# Patient Record
Sex: Female | Born: 1945
Health system: Southern US, Community
[De-identification: ages and names within clinical notes are randomized; demographics above are authoritative.]

## PROBLEM LIST (undated history)

## (undated) DIAGNOSIS — M1990 Unspecified osteoarthritis, unspecified site: Secondary | ICD-10-CM

## (undated) DIAGNOSIS — I10 Essential (primary) hypertension: Secondary | ICD-10-CM

## (undated) DIAGNOSIS — E079 Disorder of thyroid, unspecified: Secondary | ICD-10-CM

## (undated) DIAGNOSIS — M109 Gout, unspecified: Secondary | ICD-10-CM

## (undated) HISTORY — PX: CYST REMOVAL TRUNK: SHX6283

---

## 2007-09-28 ENCOUNTER — Ambulatory Visit (HOSPITAL_COMMUNITY): Admission: RE | Admit: 2007-09-28 | Discharge: 2007-09-28 | Payer: Self-pay | Admitting: Orthopaedic Surgery

## 2007-10-12 ENCOUNTER — Encounter (HOSPITAL_COMMUNITY): Admission: RE | Admit: 2007-10-12 | Discharge: 2007-11-11 | Payer: Self-pay | Admitting: Orthopaedic Surgery

## 2007-12-12 ENCOUNTER — Emergency Department (HOSPITAL_COMMUNITY): Admission: EM | Admit: 2007-12-12 | Discharge: 2007-12-12 | Payer: Self-pay | Admitting: Emergency Medicine

## 2007-12-15 ENCOUNTER — Emergency Department (HOSPITAL_COMMUNITY): Admission: EM | Admit: 2007-12-15 | Discharge: 2007-12-15 | Payer: Self-pay | Admitting: Family Medicine

## 2008-11-05 ENCOUNTER — Emergency Department (HOSPITAL_COMMUNITY): Admission: EM | Admit: 2008-11-05 | Discharge: 2008-11-05 | Payer: Self-pay | Admitting: Family Medicine

## 2009-12-13 ENCOUNTER — Ambulatory Visit: Payer: Self-pay | Admitting: Otolaryngology

## 2010-04-08 ENCOUNTER — Emergency Department (HOSPITAL_COMMUNITY)
Admission: EM | Admit: 2010-04-08 | Discharge: 2010-04-08 | Payer: Self-pay | Source: Home / Self Care | Admitting: Emergency Medicine

## 2011-01-15 LAB — POCT URINALYSIS DIP (DEVICE)
Bilirubin Urine: NEGATIVE
Hgb urine dipstick: NEGATIVE
Ketones, ur: NEGATIVE
Specific Gravity, Urine: 1.01
pH: 7

## 2014-06-02 ENCOUNTER — Other Ambulatory Visit (HOSPITAL_COMMUNITY): Payer: Self-pay | Admitting: Family Medicine

## 2014-06-08 ENCOUNTER — Other Ambulatory Visit (HOSPITAL_COMMUNITY): Payer: Self-pay | Admitting: Family Medicine

## 2014-06-08 DIAGNOSIS — Z780 Asymptomatic menopausal state: Secondary | ICD-10-CM

## 2014-06-08 DIAGNOSIS — Z8262 Family history of osteoporosis: Secondary | ICD-10-CM

## 2014-06-14 ENCOUNTER — Other Ambulatory Visit (HOSPITAL_COMMUNITY): Payer: Self-pay

## 2014-07-07 ENCOUNTER — Other Ambulatory Visit (HOSPITAL_COMMUNITY): Payer: Self-pay

## 2014-07-10 ENCOUNTER — Ambulatory Visit (HOSPITAL_COMMUNITY)
Admission: RE | Admit: 2014-07-10 | Discharge: 2014-07-10 | Disposition: A | Discharge status: Home / Self Care | Payer: 59 | Source: Ambulatory Visit | Attending: Family Medicine | Admitting: Family Medicine

## 2014-07-10 DIAGNOSIS — Z8262 Family history of osteoporosis: Secondary | ICD-10-CM | POA: Insufficient documentation

## 2014-07-10 DIAGNOSIS — M8580 Other specified disorders of bone density and structure, unspecified site: Secondary | ICD-10-CM | POA: Diagnosis not present

## 2014-07-10 DIAGNOSIS — Z780 Asymptomatic menopausal state: Secondary | ICD-10-CM | POA: Insufficient documentation

## 2015-05-01 DIAGNOSIS — E039 Hypothyroidism, unspecified: Secondary | ICD-10-CM | POA: Diagnosis not present

## 2015-05-01 DIAGNOSIS — R739 Hyperglycemia, unspecified: Secondary | ICD-10-CM | POA: Diagnosis not present

## 2015-05-01 DIAGNOSIS — Z1389 Encounter for screening for other disorder: Secondary | ICD-10-CM | POA: Diagnosis not present

## 2015-05-01 DIAGNOSIS — E782 Mixed hyperlipidemia: Secondary | ICD-10-CM | POA: Diagnosis not present

## 2015-05-01 DIAGNOSIS — I10 Essential (primary) hypertension: Secondary | ICD-10-CM | POA: Diagnosis not present

## 2015-05-01 DIAGNOSIS — M109 Gout, unspecified: Secondary | ICD-10-CM | POA: Diagnosis not present

## 2015-05-01 DIAGNOSIS — Z6828 Body mass index (BMI) 28.0-28.9, adult: Secondary | ICD-10-CM | POA: Diagnosis not present

## 2015-05-17 DIAGNOSIS — Z1389 Encounter for screening for other disorder: Secondary | ICD-10-CM | POA: Diagnosis not present

## 2015-05-17 DIAGNOSIS — E039 Hypothyroidism, unspecified: Secondary | ICD-10-CM | POA: Diagnosis not present

## 2015-05-17 DIAGNOSIS — I10 Essential (primary) hypertension: Secondary | ICD-10-CM | POA: Diagnosis not present

## 2015-05-17 DIAGNOSIS — M109 Gout, unspecified: Secondary | ICD-10-CM | POA: Diagnosis not present

## 2015-05-17 DIAGNOSIS — E782 Mixed hyperlipidemia: Secondary | ICD-10-CM | POA: Diagnosis not present

## 2015-05-29 DIAGNOSIS — Z6827 Body mass index (BMI) 27.0-27.9, adult: Secondary | ICD-10-CM | POA: Diagnosis not present

## 2015-05-29 DIAGNOSIS — Z1389 Encounter for screening for other disorder: Secondary | ICD-10-CM | POA: Diagnosis not present

## 2015-05-29 DIAGNOSIS — E785 Hyperlipidemia, unspecified: Secondary | ICD-10-CM | POA: Diagnosis not present

## 2015-06-27 DIAGNOSIS — E663 Overweight: Secondary | ICD-10-CM | POA: Diagnosis not present

## 2015-06-27 DIAGNOSIS — J040 Acute laryngitis: Secondary | ICD-10-CM | POA: Diagnosis not present

## 2015-06-27 DIAGNOSIS — Z6827 Body mass index (BMI) 27.0-27.9, adult: Secondary | ICD-10-CM | POA: Diagnosis not present

## 2015-06-27 DIAGNOSIS — Z1389 Encounter for screening for other disorder: Secondary | ICD-10-CM | POA: Diagnosis not present

## 2015-06-27 DIAGNOSIS — J209 Acute bronchitis, unspecified: Secondary | ICD-10-CM | POA: Diagnosis not present

## 2015-06-27 MED FILL — LISINOPRIL-HCTZ 20-12.5 MG: 20-12.5 | 90 days supply | Qty: 90 | Fill #0

## 2015-06-27 MED FILL — PANTOPRAZOLE SOD DR 40 MG T: 40 | 90 days supply | Qty: 90 | Fill #0

## 2015-06-27 MED FILL — ALLOPURINOL 300 MG TABLET: 300 | 90 days supply | Qty: 90 | Fill #0

## 2015-06-27 MED FILL — METOPROLOL SUCC ER 25 MG TA: 25 | 90 days supply | Qty: 90 | Fill #0

## 2015-07-26 MED FILL — LEVOTHYROXINE 25 MCG TABLET: 25 | 30 days supply | Qty: 30 | Fill #0

## 2015-08-28 MED FILL — LEVOTHYROXINE 25 MCG TABLET: 25 | 30 days supply | Qty: 30 | Fill #0

## 2015-09-11 DIAGNOSIS — E063 Autoimmune thyroiditis: Secondary | ICD-10-CM | POA: Diagnosis not present

## 2015-09-11 DIAGNOSIS — R7309 Other abnormal glucose: Secondary | ICD-10-CM | POA: Diagnosis not present

## 2015-09-11 DIAGNOSIS — Z6827 Body mass index (BMI) 27.0-27.9, adult: Secondary | ICD-10-CM | POA: Diagnosis not present

## 2015-09-11 DIAGNOSIS — Z1389 Encounter for screening for other disorder: Secondary | ICD-10-CM | POA: Diagnosis not present

## 2015-09-11 DIAGNOSIS — E782 Mixed hyperlipidemia: Secondary | ICD-10-CM | POA: Diagnosis not present

## 2015-09-25 MED FILL — LISINOPRIL-HCTZ 20-12.5 MG: 20-12.5 | 90 days supply | Qty: 90 | Fill #1

## 2015-09-25 MED FILL — ALLOPURINOL 300 MG TABLET: 300 | 90 days supply | Qty: 90 | Fill #1

## 2015-09-25 MED FILL — PANTOPRAZOLE SOD DR 40 MG T: 40 | 90 days supply | Qty: 90 | Fill #1

## 2015-09-25 MED FILL — LEVOTHYROXINE 25 MCG TABLET: 25 | 30 days supply | Qty: 30 | Fill #1

## 2015-09-25 MED FILL — METOPROLOL SUCC ER 25 MG TA: 25 | 90 days supply | Qty: 90 | Fill #1

## 2015-09-28 DIAGNOSIS — R7309 Other abnormal glucose: Secondary | ICD-10-CM | POA: Diagnosis not present

## 2015-09-28 DIAGNOSIS — E782 Mixed hyperlipidemia: Secondary | ICD-10-CM | POA: Diagnosis not present

## 2015-09-28 DIAGNOSIS — Z1389 Encounter for screening for other disorder: Secondary | ICD-10-CM | POA: Diagnosis not present

## 2015-09-28 DIAGNOSIS — E063 Autoimmune thyroiditis: Secondary | ICD-10-CM | POA: Diagnosis not present

## 2015-10-24 MED FILL — LEVOTHYROXINE 25 MCG TABLET: 25 | 30 days supply | Qty: 30 | Fill #2

## 2015-11-26 MED FILL — LEVOTHYROXINE 25 MCG TABLET: 25 | 30 days supply | Qty: 30 | Fill #0

## 2015-12-03 DIAGNOSIS — H524 Presbyopia: Secondary | ICD-10-CM | POA: Diagnosis not present

## 2015-12-03 DIAGNOSIS — H26051 Posterior subcapsular polar infantile and juvenile cataract, right eye: Secondary | ICD-10-CM | POA: Diagnosis not present

## 2015-12-03 DIAGNOSIS — H52223 Regular astigmatism, bilateral: Secondary | ICD-10-CM | POA: Diagnosis not present

## 2015-12-03 DIAGNOSIS — H5203 Hypermetropia, bilateral: Secondary | ICD-10-CM | POA: Diagnosis not present

## 2015-12-14 DIAGNOSIS — K219 Gastro-esophageal reflux disease without esophagitis: Secondary | ICD-10-CM | POA: Diagnosis not present

## 2015-12-14 DIAGNOSIS — L299 Pruritus, unspecified: Secondary | ICD-10-CM | POA: Diagnosis not present

## 2015-12-14 DIAGNOSIS — R21 Rash and other nonspecific skin eruption: Secondary | ICD-10-CM | POA: Diagnosis not present

## 2015-12-14 DIAGNOSIS — S0081XD Abrasion of other part of head, subsequent encounter: Secondary | ICD-10-CM | POA: Diagnosis not present

## 2015-12-14 DIAGNOSIS — Z1389 Encounter for screening for other disorder: Secondary | ICD-10-CM | POA: Diagnosis not present

## 2015-12-14 DIAGNOSIS — Z6827 Body mass index (BMI) 27.0-27.9, adult: Secondary | ICD-10-CM | POA: Diagnosis not present

## 2015-12-18 DIAGNOSIS — Z1389 Encounter for screening for other disorder: Secondary | ICD-10-CM | POA: Diagnosis not present

## 2015-12-18 DIAGNOSIS — L299 Pruritus, unspecified: Secondary | ICD-10-CM | POA: Diagnosis not present

## 2015-12-24 MED FILL — LEVOTHYROXINE 25 MCG TABLET: 25 | 60 days supply | Qty: 60 | Fill #1

## 2015-12-27 MED FILL — ALLOPURINOL 300 MG TABLET: 300 | 90 days supply | Qty: 90 | Fill #2

## 2015-12-27 MED FILL — METOPROLOL SUCC ER 25 MG TA: 25 | 90 days supply | Qty: 90 | Fill #2

## 2015-12-27 MED FILL — LISINOPRIL-HCTZ 20-12.5 MG: 20-12.5 | 90 days supply | Qty: 90 | Fill #2

## 2015-12-27 MED FILL — PANTOPRAZOLE SOD DR 40 MG T: 40 | 90 days supply | Qty: 90 | Fill #2

## 2016-02-25 MED FILL — LEVOTHYROXINE 25 MCG TABLET: 25 | 90 days supply | Qty: 90 | Fill #0

## 2016-03-21 MED FILL — LISINOPRIL-HCTZ 20-12.5 MG: 20-12.5 | 90 days supply | Qty: 90 | Fill #3

## 2016-03-21 MED FILL — METOPROLOL SUCC ER 25 MG TA: 25 | 90 days supply | Qty: 90 | Fill #3

## 2016-03-21 MED FILL — PANTOPRAZOLE SOD DR 40 MG T: 40 | 90 days supply | Qty: 90 | Fill #3

## 2016-03-21 MED FILL — ALLOPURINOL 300 MG TABLET: 300 | 90 days supply | Qty: 90 | Fill #3

## 2016-04-08 DIAGNOSIS — M109 Gout, unspecified: Secondary | ICD-10-CM | POA: Diagnosis not present

## 2016-04-08 DIAGNOSIS — J302 Other seasonal allergic rhinitis: Secondary | ICD-10-CM | POA: Diagnosis not present

## 2016-04-08 DIAGNOSIS — Z6828 Body mass index (BMI) 28.0-28.9, adult: Secondary | ICD-10-CM | POA: Diagnosis not present

## 2016-04-08 DIAGNOSIS — I10 Essential (primary) hypertension: Secondary | ICD-10-CM | POA: Diagnosis not present

## 2016-04-08 DIAGNOSIS — E782 Mixed hyperlipidemia: Secondary | ICD-10-CM | POA: Diagnosis not present

## 2016-04-08 DIAGNOSIS — K219 Gastro-esophageal reflux disease without esophagitis: Secondary | ICD-10-CM | POA: Diagnosis not present

## 2016-04-08 DIAGNOSIS — G894 Chronic pain syndrome: Secondary | ICD-10-CM | POA: Diagnosis not present

## 2016-04-08 DIAGNOSIS — F419 Anxiety disorder, unspecified: Secondary | ICD-10-CM | POA: Diagnosis not present

## 2016-04-08 DIAGNOSIS — E119 Type 2 diabetes mellitus without complications: Secondary | ICD-10-CM | POA: Diagnosis not present

## 2016-04-08 DIAGNOSIS — E663 Overweight: Secondary | ICD-10-CM | POA: Diagnosis not present

## 2016-04-08 DIAGNOSIS — Z1389 Encounter for screening for other disorder: Secondary | ICD-10-CM | POA: Diagnosis not present

## 2016-05-26 MED FILL — LEVOTHYROXINE 25 MCG TABLET: 25 | 90 days supply | Qty: 90 | Fill #1

## 2016-06-03 DIAGNOSIS — E782 Mixed hyperlipidemia: Secondary | ICD-10-CM | POA: Diagnosis not present

## 2016-06-03 DIAGNOSIS — J329 Chronic sinusitis, unspecified: Secondary | ICD-10-CM | POA: Diagnosis not present

## 2016-06-03 DIAGNOSIS — Z6827 Body mass index (BMI) 27.0-27.9, adult: Secondary | ICD-10-CM | POA: Diagnosis not present

## 2016-06-03 DIAGNOSIS — Z1389 Encounter for screening for other disorder: Secondary | ICD-10-CM | POA: Diagnosis not present

## 2016-06-20 DIAGNOSIS — Z6827 Body mass index (BMI) 27.0-27.9, adult: Secondary | ICD-10-CM | POA: Diagnosis not present

## 2016-06-20 DIAGNOSIS — I10 Essential (primary) hypertension: Secondary | ICD-10-CM | POA: Diagnosis not present

## 2016-06-20 DIAGNOSIS — K219 Gastro-esophageal reflux disease without esophagitis: Secondary | ICD-10-CM | POA: Diagnosis not present

## 2016-06-20 DIAGNOSIS — Z1389 Encounter for screening for other disorder: Secondary | ICD-10-CM | POA: Diagnosis not present

## 2016-06-20 DIAGNOSIS — E782 Mixed hyperlipidemia: Secondary | ICD-10-CM | POA: Diagnosis not present

## 2016-06-20 MED FILL — METOPROLOL SUCC ER 25 MG TA: 25 | 90 days supply | Qty: 90 | Fill #0

## 2016-06-20 MED FILL — PANTOPRAZOLE SOD DR 40 MG T: 40 | 90 days supply | Qty: 90 | Fill #0

## 2016-06-20 MED FILL — ALLOPURINOL 300 MG TABLET: 300 | 90 days supply | Qty: 90 | Fill #0

## 2016-06-20 MED FILL — LISINOPRIL-HCTZ 20-12.5 MG: 20-12.5 | 90 days supply | Qty: 90 | Fill #0

## 2016-07-04 DIAGNOSIS — I10 Essential (primary) hypertension: Secondary | ICD-10-CM | POA: Diagnosis not present

## 2016-07-04 DIAGNOSIS — K219 Gastro-esophageal reflux disease without esophagitis: Secondary | ICD-10-CM | POA: Diagnosis not present

## 2016-07-04 DIAGNOSIS — M150 Primary generalized (osteo)arthritis: Secondary | ICD-10-CM | POA: Diagnosis not present

## 2016-07-04 DIAGNOSIS — E782 Mixed hyperlipidemia: Secondary | ICD-10-CM | POA: Diagnosis not present

## 2016-07-04 DIAGNOSIS — R6884 Jaw pain: Secondary | ICD-10-CM | POA: Diagnosis not present

## 2016-07-04 DIAGNOSIS — E039 Hypothyroidism, unspecified: Secondary | ICD-10-CM | POA: Diagnosis not present

## 2016-07-04 DIAGNOSIS — I7300 Raynaud's syndrome without gangrene: Secondary | ICD-10-CM | POA: Diagnosis not present

## 2016-07-04 DIAGNOSIS — R7301 Impaired fasting glucose: Secondary | ICD-10-CM | POA: Diagnosis not present

## 2016-07-04 DIAGNOSIS — F411 Generalized anxiety disorder: Secondary | ICD-10-CM | POA: Diagnosis not present

## 2016-07-23 DIAGNOSIS — E039 Hypothyroidism, unspecified: Secondary | ICD-10-CM | POA: Diagnosis not present

## 2016-07-23 DIAGNOSIS — R7301 Impaired fasting glucose: Secondary | ICD-10-CM | POA: Diagnosis not present

## 2016-07-23 DIAGNOSIS — I10 Essential (primary) hypertension: Secondary | ICD-10-CM | POA: Diagnosis not present

## 2016-07-26 DIAGNOSIS — I7300 Raynaud's syndrome without gangrene: Secondary | ICD-10-CM | POA: Diagnosis not present

## 2016-07-26 DIAGNOSIS — E039 Hypothyroidism, unspecified: Secondary | ICD-10-CM | POA: Diagnosis not present

## 2016-07-26 DIAGNOSIS — M150 Primary generalized (osteo)arthritis: Secondary | ICD-10-CM | POA: Diagnosis not present

## 2016-07-26 DIAGNOSIS — M1009 Idiopathic gout, multiple sites: Secondary | ICD-10-CM | POA: Diagnosis not present

## 2016-07-26 DIAGNOSIS — K219 Gastro-esophageal reflux disease without esophagitis: Secondary | ICD-10-CM | POA: Diagnosis not present

## 2016-07-26 DIAGNOSIS — R7301 Impaired fasting glucose: Secondary | ICD-10-CM | POA: Diagnosis not present

## 2016-07-26 DIAGNOSIS — I10 Essential (primary) hypertension: Secondary | ICD-10-CM | POA: Diagnosis not present

## 2016-07-26 DIAGNOSIS — R6884 Jaw pain: Secondary | ICD-10-CM | POA: Diagnosis not present

## 2016-07-26 DIAGNOSIS — E782 Mixed hyperlipidemia: Secondary | ICD-10-CM | POA: Diagnosis not present

## 2016-08-21 MED FILL — LEVOTHYROXINE 25 MCG TABLET: 25 | 90 days supply | Qty: 90 | Fill #0

## 2016-09-22 MED FILL — ALLOPURINOL 300 MG TABLET: 300 | 90 days supply | Qty: 90 | Fill #1

## 2016-09-22 MED FILL — METOPROLOL SUCC ER 25 MG TA: 25 | 90 days supply | Qty: 90 | Fill #1

## 2016-09-22 MED FILL — LISINOPRIL-HCTZ 20-12.5 MG: 20-12.5 | 90 days supply | Qty: 90 | Fill #1

## 2016-09-22 MED FILL — PANTOPRAZOLE SOD DR 40 MG T: 40 | 90 days supply | Qty: 90 | Fill #1

## 2016-11-14 DIAGNOSIS — H10501 Unspecified blepharoconjunctivitis, right eye: Secondary | ICD-10-CM | POA: Diagnosis not present

## 2016-11-18 MED FILL — LEVOTHYROXINE 25 MCG TABLET: 25 | 90 days supply | Qty: 90 | Fill #1 | Status: TO

## 2016-11-26 DIAGNOSIS — Z1159 Encounter for screening for other viral diseases: Secondary | ICD-10-CM | POA: Diagnosis not present

## 2016-11-26 DIAGNOSIS — I10 Essential (primary) hypertension: Secondary | ICD-10-CM | POA: Diagnosis not present

## 2016-11-26 DIAGNOSIS — R7301 Impaired fasting glucose: Secondary | ICD-10-CM | POA: Diagnosis not present

## 2016-11-26 DIAGNOSIS — E039 Hypothyroidism, unspecified: Secondary | ICD-10-CM | POA: Diagnosis not present

## 2016-11-28 DIAGNOSIS — I10 Essential (primary) hypertension: Secondary | ICD-10-CM | POA: Diagnosis not present

## 2016-11-28 DIAGNOSIS — E782 Mixed hyperlipidemia: Secondary | ICD-10-CM | POA: Diagnosis not present

## 2016-11-28 DIAGNOSIS — M1009 Idiopathic gout, multiple sites: Secondary | ICD-10-CM | POA: Diagnosis not present

## 2016-11-28 DIAGNOSIS — M150 Primary generalized (osteo)arthritis: Secondary | ICD-10-CM | POA: Diagnosis not present

## 2016-11-28 DIAGNOSIS — F411 Generalized anxiety disorder: Secondary | ICD-10-CM | POA: Diagnosis not present

## 2016-11-28 DIAGNOSIS — K219 Gastro-esophageal reflux disease without esophagitis: Secondary | ICD-10-CM | POA: Diagnosis not present

## 2016-11-28 DIAGNOSIS — L309 Dermatitis, unspecified: Secondary | ICD-10-CM | POA: Diagnosis not present

## 2016-11-28 DIAGNOSIS — R7301 Impaired fasting glucose: Secondary | ICD-10-CM | POA: Diagnosis not present

## 2016-11-28 DIAGNOSIS — E039 Hypothyroidism, unspecified: Secondary | ICD-10-CM | POA: Diagnosis not present

## 2016-12-03 DIAGNOSIS — H52223 Regular astigmatism, bilateral: Secondary | ICD-10-CM | POA: Diagnosis not present

## 2016-12-03 DIAGNOSIS — H524 Presbyopia: Secondary | ICD-10-CM | POA: Diagnosis not present

## 2016-12-03 DIAGNOSIS — H5203 Hypermetropia, bilateral: Secondary | ICD-10-CM | POA: Diagnosis not present

## 2016-12-17 MED FILL — ALLOPURINOL 300 MG TABLET: 300 | 90 days supply | Qty: 90 | Fill #2 | Status: TO

## 2016-12-17 MED FILL — METOPROLOL SUCC ER 25 MG TA: 25 | 90 days supply | Qty: 90 | Fill #2 | Status: TO

## 2016-12-17 MED FILL — LISINOPRIL-HCTZ 20-12.5 MG: 20-12.5 | 90 days supply | Qty: 90 | Fill #2 | Status: TO

## 2016-12-17 MED FILL — PANTOPRAZOLE SOD DR 40 MG T: 40 | 90 days supply | Qty: 90 | Fill #2 | Status: TO

## 2017-06-03 DIAGNOSIS — R7301 Impaired fasting glucose: Secondary | ICD-10-CM | POA: Diagnosis not present

## 2017-06-03 DIAGNOSIS — I10 Essential (primary) hypertension: Secondary | ICD-10-CM | POA: Diagnosis not present

## 2017-06-03 DIAGNOSIS — E039 Hypothyroidism, unspecified: Secondary | ICD-10-CM | POA: Diagnosis not present

## 2017-06-03 DIAGNOSIS — F411 Generalized anxiety disorder: Secondary | ICD-10-CM | POA: Diagnosis not present

## 2017-06-03 DIAGNOSIS — E782 Mixed hyperlipidemia: Secondary | ICD-10-CM | POA: Diagnosis not present

## 2017-06-05 DIAGNOSIS — C44300 Unspecified malignant neoplasm of skin of unspecified part of face: Secondary | ICD-10-CM | POA: Diagnosis not present

## 2017-06-05 DIAGNOSIS — I7300 Raynaud's syndrome without gangrene: Secondary | ICD-10-CM | POA: Diagnosis not present

## 2017-06-05 DIAGNOSIS — E782 Mixed hyperlipidemia: Secondary | ICD-10-CM | POA: Diagnosis not present

## 2017-06-05 DIAGNOSIS — K219 Gastro-esophageal reflux disease without esophagitis: Secondary | ICD-10-CM | POA: Diagnosis not present

## 2017-06-05 DIAGNOSIS — M150 Primary generalized (osteo)arthritis: Secondary | ICD-10-CM | POA: Diagnosis not present

## 2017-06-05 DIAGNOSIS — Z6823 Body mass index (BMI) 23.0-23.9, adult: Secondary | ICD-10-CM | POA: Diagnosis not present

## 2017-06-05 DIAGNOSIS — E039 Hypothyroidism, unspecified: Secondary | ICD-10-CM | POA: Diagnosis not present

## 2017-06-05 DIAGNOSIS — J069 Acute upper respiratory infection, unspecified: Secondary | ICD-10-CM | POA: Diagnosis not present

## 2017-06-05 DIAGNOSIS — F411 Generalized anxiety disorder: Secondary | ICD-10-CM | POA: Diagnosis not present

## 2017-06-05 DIAGNOSIS — I10 Essential (primary) hypertension: Secondary | ICD-10-CM | POA: Diagnosis not present

## 2017-06-05 DIAGNOSIS — R7301 Impaired fasting glucose: Secondary | ICD-10-CM | POA: Diagnosis not present

## 2017-07-19 DIAGNOSIS — J060 Acute laryngopharyngitis: Secondary | ICD-10-CM | POA: Diagnosis not present

## 2017-07-19 DIAGNOSIS — J0100 Acute maxillary sinusitis, unspecified: Secondary | ICD-10-CM | POA: Diagnosis not present

## 2017-11-30 DIAGNOSIS — M1009 Idiopathic gout, multiple sites: Secondary | ICD-10-CM | POA: Diagnosis not present

## 2017-11-30 DIAGNOSIS — E039 Hypothyroidism, unspecified: Secondary | ICD-10-CM | POA: Diagnosis not present

## 2017-11-30 DIAGNOSIS — F411 Generalized anxiety disorder: Secondary | ICD-10-CM | POA: Diagnosis not present

## 2017-11-30 DIAGNOSIS — E782 Mixed hyperlipidemia: Secondary | ICD-10-CM | POA: Diagnosis not present

## 2017-11-30 DIAGNOSIS — I7300 Raynaud's syndrome without gangrene: Secondary | ICD-10-CM | POA: Diagnosis not present

## 2017-11-30 DIAGNOSIS — R7301 Impaired fasting glucose: Secondary | ICD-10-CM | POA: Diagnosis not present

## 2017-11-30 DIAGNOSIS — K219 Gastro-esophageal reflux disease without esophagitis: Secondary | ICD-10-CM | POA: Diagnosis not present

## 2017-11-30 DIAGNOSIS — J0100 Acute maxillary sinusitis, unspecified: Secondary | ICD-10-CM | POA: Diagnosis not present

## 2017-11-30 DIAGNOSIS — Z6825 Body mass index (BMI) 25.0-25.9, adult: Secondary | ICD-10-CM | POA: Diagnosis not present

## 2017-11-30 DIAGNOSIS — J069 Acute upper respiratory infection, unspecified: Secondary | ICD-10-CM | POA: Diagnosis not present

## 2017-11-30 DIAGNOSIS — Z6823 Body mass index (BMI) 23.0-23.9, adult: Secondary | ICD-10-CM | POA: Diagnosis not present

## 2017-11-30 DIAGNOSIS — I10 Essential (primary) hypertension: Secondary | ICD-10-CM | POA: Diagnosis not present

## 2017-11-30 DIAGNOSIS — C44300 Unspecified malignant neoplasm of skin of unspecified part of face: Secondary | ICD-10-CM | POA: Diagnosis not present

## 2017-11-30 DIAGNOSIS — R51 Headache: Secondary | ICD-10-CM | POA: Diagnosis not present

## 2017-11-30 DIAGNOSIS — M150 Primary generalized (osteo)arthritis: Secondary | ICD-10-CM | POA: Diagnosis not present

## 2017-12-04 DIAGNOSIS — R7301 Impaired fasting glucose: Secondary | ICD-10-CM | POA: Diagnosis not present

## 2017-12-04 DIAGNOSIS — E039 Hypothyroidism, unspecified: Secondary | ICD-10-CM | POA: Diagnosis not present

## 2017-12-04 DIAGNOSIS — Z6825 Body mass index (BMI) 25.0-25.9, adult: Secondary | ICD-10-CM | POA: Diagnosis not present

## 2017-12-04 DIAGNOSIS — C44300 Unspecified malignant neoplasm of skin of unspecified part of face: Secondary | ICD-10-CM | POA: Diagnosis not present

## 2017-12-04 DIAGNOSIS — I10 Essential (primary) hypertension: Secondary | ICD-10-CM | POA: Diagnosis not present

## 2017-12-04 DIAGNOSIS — E782 Mixed hyperlipidemia: Secondary | ICD-10-CM | POA: Diagnosis not present

## 2017-12-04 DIAGNOSIS — J019 Acute sinusitis, unspecified: Secondary | ICD-10-CM | POA: Diagnosis not present

## 2017-12-04 DIAGNOSIS — J060 Acute laryngopharyngitis: Secondary | ICD-10-CM | POA: Diagnosis not present

## 2017-12-04 DIAGNOSIS — M1009 Idiopathic gout, multiple sites: Secondary | ICD-10-CM | POA: Diagnosis not present

## 2017-12-04 DIAGNOSIS — K219 Gastro-esophageal reflux disease without esophagitis: Secondary | ICD-10-CM | POA: Diagnosis not present

## 2017-12-04 DIAGNOSIS — F411 Generalized anxiety disorder: Secondary | ICD-10-CM | POA: Diagnosis not present

## 2017-12-04 DIAGNOSIS — M150 Primary generalized (osteo)arthritis: Secondary | ICD-10-CM | POA: Diagnosis not present

## 2017-12-07 ENCOUNTER — Other Ambulatory Visit: Payer: Self-pay | Admitting: Internal Medicine

## 2017-12-07 DIAGNOSIS — Z780 Asymptomatic menopausal state: Secondary | ICD-10-CM

## 2018-01-04 ENCOUNTER — Ambulatory Visit (HOSPITAL_COMMUNITY)
Admission: RE | Admit: 2018-01-04 | Discharge: 2018-01-04 | Disposition: A | Discharge status: Home / Self Care | Payer: PPO | Source: Ambulatory Visit | Attending: Internal Medicine | Admitting: Internal Medicine

## 2018-01-04 DIAGNOSIS — M85832 Other specified disorders of bone density and structure, left forearm: Secondary | ICD-10-CM | POA: Diagnosis not present

## 2018-01-04 DIAGNOSIS — M810 Age-related osteoporosis without current pathological fracture: Secondary | ICD-10-CM | POA: Insufficient documentation

## 2018-01-04 DIAGNOSIS — Z780 Asymptomatic menopausal state: Secondary | ICD-10-CM

## 2018-01-18 DIAGNOSIS — Z23 Encounter for immunization: Secondary | ICD-10-CM | POA: Diagnosis not present

## 2018-02-16 DIAGNOSIS — H04321 Acute dacryocystitis of right lacrimal passage: Secondary | ICD-10-CM | POA: Diagnosis not present

## 2018-02-26 DIAGNOSIS — K219 Gastro-esophageal reflux disease without esophagitis: Secondary | ICD-10-CM | POA: Diagnosis not present

## 2018-02-26 DIAGNOSIS — I10 Essential (primary) hypertension: Secondary | ICD-10-CM | POA: Diagnosis not present

## 2018-02-26 DIAGNOSIS — E039 Hypothyroidism, unspecified: Secondary | ICD-10-CM | POA: Diagnosis not present

## 2018-02-26 DIAGNOSIS — E782 Mixed hyperlipidemia: Secondary | ICD-10-CM | POA: Diagnosis not present

## 2018-03-17 DIAGNOSIS — I10 Essential (primary) hypertension: Secondary | ICD-10-CM | POA: Diagnosis not present

## 2018-03-17 DIAGNOSIS — E782 Mixed hyperlipidemia: Secondary | ICD-10-CM | POA: Diagnosis not present

## 2018-06-10 DIAGNOSIS — E782 Mixed hyperlipidemia: Secondary | ICD-10-CM | POA: Diagnosis not present

## 2018-06-10 DIAGNOSIS — K219 Gastro-esophageal reflux disease without esophagitis: Secondary | ICD-10-CM | POA: Diagnosis not present

## 2018-06-10 DIAGNOSIS — I10 Essential (primary) hypertension: Secondary | ICD-10-CM | POA: Diagnosis not present

## 2018-06-10 DIAGNOSIS — M1009 Idiopathic gout, multiple sites: Secondary | ICD-10-CM | POA: Diagnosis not present

## 2018-06-10 DIAGNOSIS — R51 Headache: Secondary | ICD-10-CM | POA: Diagnosis not present

## 2018-06-10 DIAGNOSIS — I7300 Raynaud's syndrome without gangrene: Secondary | ICD-10-CM | POA: Diagnosis not present

## 2018-06-10 DIAGNOSIS — R7301 Impaired fasting glucose: Secondary | ICD-10-CM | POA: Diagnosis not present

## 2018-06-10 DIAGNOSIS — F411 Generalized anxiety disorder: Secondary | ICD-10-CM | POA: Diagnosis not present

## 2018-06-10 DIAGNOSIS — J069 Acute upper respiratory infection, unspecified: Secondary | ICD-10-CM | POA: Diagnosis not present

## 2018-06-10 DIAGNOSIS — M150 Primary generalized (osteo)arthritis: Secondary | ICD-10-CM | POA: Diagnosis not present

## 2018-06-10 DIAGNOSIS — C44300 Unspecified malignant neoplasm of skin of unspecified part of face: Secondary | ICD-10-CM | POA: Diagnosis not present

## 2018-06-10 DIAGNOSIS — E039 Hypothyroidism, unspecified: Secondary | ICD-10-CM | POA: Diagnosis not present

## 2018-06-14 DIAGNOSIS — M1009 Idiopathic gout, multiple sites: Secondary | ICD-10-CM | POA: Diagnosis not present

## 2018-06-14 DIAGNOSIS — I10 Essential (primary) hypertension: Secondary | ICD-10-CM | POA: Diagnosis not present

## 2018-06-14 DIAGNOSIS — E039 Hypothyroidism, unspecified: Secondary | ICD-10-CM | POA: Diagnosis not present

## 2018-06-14 DIAGNOSIS — C44300 Unspecified malignant neoplasm of skin of unspecified part of face: Secondary | ICD-10-CM | POA: Diagnosis not present

## 2018-06-14 DIAGNOSIS — H0266 Xanthelasma of left eye, unspecified eyelid: Secondary | ICD-10-CM | POA: Diagnosis not present

## 2018-06-14 DIAGNOSIS — E782 Mixed hyperlipidemia: Secondary | ICD-10-CM | POA: Diagnosis not present

## 2018-06-14 DIAGNOSIS — K219 Gastro-esophageal reflux disease without esophagitis: Secondary | ICD-10-CM | POA: Diagnosis not present

## 2018-06-14 DIAGNOSIS — M810 Age-related osteoporosis without current pathological fracture: Secondary | ICD-10-CM | POA: Diagnosis not present

## 2018-06-14 DIAGNOSIS — R7301 Impaired fasting glucose: Secondary | ICD-10-CM | POA: Diagnosis not present

## 2018-06-23 DIAGNOSIS — Z85828 Personal history of other malignant neoplasm of skin: Secondary | ICD-10-CM | POA: Diagnosis not present

## 2018-06-23 DIAGNOSIS — Z08 Encounter for follow-up examination after completed treatment for malignant neoplasm: Secondary | ICD-10-CM | POA: Diagnosis not present

## 2018-06-23 DIAGNOSIS — L728 Other follicular cysts of the skin and subcutaneous tissue: Secondary | ICD-10-CM | POA: Diagnosis not present

## 2018-07-13 DIAGNOSIS — C44300 Unspecified malignant neoplasm of skin of unspecified part of face: Secondary | ICD-10-CM | POA: Diagnosis not present

## 2018-07-13 DIAGNOSIS — E782 Mixed hyperlipidemia: Secondary | ICD-10-CM | POA: Diagnosis not present

## 2018-07-13 DIAGNOSIS — M1009 Idiopathic gout, multiple sites: Secondary | ICD-10-CM | POA: Diagnosis not present

## 2018-07-13 DIAGNOSIS — E039 Hypothyroidism, unspecified: Secondary | ICD-10-CM | POA: Diagnosis not present

## 2018-07-13 DIAGNOSIS — I10 Essential (primary) hypertension: Secondary | ICD-10-CM | POA: Diagnosis not present

## 2018-07-13 DIAGNOSIS — K219 Gastro-esophageal reflux disease without esophagitis: Secondary | ICD-10-CM | POA: Diagnosis not present

## 2018-07-13 DIAGNOSIS — R7301 Impaired fasting glucose: Secondary | ICD-10-CM | POA: Diagnosis not present

## 2018-07-27 DIAGNOSIS — Z0000 Encounter for general adult medical examination without abnormal findings: Secondary | ICD-10-CM | POA: Diagnosis not present

## 2018-07-30 DIAGNOSIS — I10 Essential (primary) hypertension: Secondary | ICD-10-CM | POA: Diagnosis not present

## 2018-07-30 DIAGNOSIS — R7301 Impaired fasting glucose: Secondary | ICD-10-CM | POA: Diagnosis not present

## 2018-07-30 DIAGNOSIS — E039 Hypothyroidism, unspecified: Secondary | ICD-10-CM | POA: Diagnosis not present

## 2018-07-30 DIAGNOSIS — K219 Gastro-esophageal reflux disease without esophagitis: Secondary | ICD-10-CM | POA: Diagnosis not present

## 2018-07-30 DIAGNOSIS — M1009 Idiopathic gout, multiple sites: Secondary | ICD-10-CM | POA: Diagnosis not present

## 2018-07-30 DIAGNOSIS — C44300 Unspecified malignant neoplasm of skin of unspecified part of face: Secondary | ICD-10-CM | POA: Diagnosis not present

## 2018-07-30 DIAGNOSIS — E782 Mixed hyperlipidemia: Secondary | ICD-10-CM | POA: Diagnosis not present

## 2018-08-17 DIAGNOSIS — E039 Hypothyroidism, unspecified: Secondary | ICD-10-CM | POA: Diagnosis not present

## 2018-08-17 DIAGNOSIS — M1009 Idiopathic gout, multiple sites: Secondary | ICD-10-CM | POA: Diagnosis not present

## 2018-08-17 DIAGNOSIS — E782 Mixed hyperlipidemia: Secondary | ICD-10-CM | POA: Diagnosis not present

## 2018-08-17 DIAGNOSIS — I10 Essential (primary) hypertension: Secondary | ICD-10-CM | POA: Diagnosis not present

## 2018-11-15 ENCOUNTER — Other Ambulatory Visit: Payer: Self-pay

## 2018-12-17 DIAGNOSIS — E039 Hypothyroidism, unspecified: Secondary | ICD-10-CM | POA: Diagnosis not present

## 2018-12-17 DIAGNOSIS — R7301 Impaired fasting glucose: Secondary | ICD-10-CM | POA: Diagnosis not present

## 2018-12-17 DIAGNOSIS — I10 Essential (primary) hypertension: Secondary | ICD-10-CM | POA: Diagnosis not present

## 2018-12-17 DIAGNOSIS — M1009 Idiopathic gout, multiple sites: Secondary | ICD-10-CM | POA: Diagnosis not present

## 2018-12-17 DIAGNOSIS — E782 Mixed hyperlipidemia: Secondary | ICD-10-CM | POA: Diagnosis not present

## 2018-12-27 DIAGNOSIS — Z23 Encounter for immunization: Secondary | ICD-10-CM | POA: Diagnosis not present

## 2018-12-27 DIAGNOSIS — M1009 Idiopathic gout, multiple sites: Secondary | ICD-10-CM | POA: Diagnosis not present

## 2018-12-27 DIAGNOSIS — I739 Peripheral vascular disease, unspecified: Secondary | ICD-10-CM | POA: Diagnosis not present

## 2018-12-27 DIAGNOSIS — H0266 Xanthelasma of left eye, unspecified eyelid: Secondary | ICD-10-CM | POA: Diagnosis not present

## 2018-12-27 DIAGNOSIS — I10 Essential (primary) hypertension: Secondary | ICD-10-CM | POA: Diagnosis not present

## 2018-12-27 DIAGNOSIS — M810 Age-related osteoporosis without current pathological fracture: Secondary | ICD-10-CM | POA: Diagnosis not present

## 2018-12-27 DIAGNOSIS — C44300 Unspecified malignant neoplasm of skin of unspecified part of face: Secondary | ICD-10-CM | POA: Diagnosis not present

## 2018-12-27 DIAGNOSIS — E782 Mixed hyperlipidemia: Secondary | ICD-10-CM | POA: Diagnosis not present

## 2018-12-27 DIAGNOSIS — R7301 Impaired fasting glucose: Secondary | ICD-10-CM | POA: Diagnosis not present

## 2018-12-27 DIAGNOSIS — Z0001 Encounter for general adult medical examination with abnormal findings: Secondary | ICD-10-CM | POA: Diagnosis not present

## 2018-12-27 DIAGNOSIS — E039 Hypothyroidism, unspecified: Secondary | ICD-10-CM | POA: Diagnosis not present

## 2018-12-27 DIAGNOSIS — K219 Gastro-esophageal reflux disease without esophagitis: Secondary | ICD-10-CM | POA: Diagnosis not present

## 2019-02-23 DIAGNOSIS — I739 Peripheral vascular disease, unspecified: Secondary | ICD-10-CM | POA: Diagnosis not present

## 2019-02-23 DIAGNOSIS — C44300 Unspecified malignant neoplasm of skin of unspecified part of face: Secondary | ICD-10-CM | POA: Diagnosis not present

## 2019-02-23 DIAGNOSIS — M1009 Idiopathic gout, multiple sites: Secondary | ICD-10-CM | POA: Diagnosis not present

## 2019-02-23 DIAGNOSIS — K219 Gastro-esophageal reflux disease without esophagitis: Secondary | ICD-10-CM | POA: Diagnosis not present

## 2019-02-23 DIAGNOSIS — H0266 Xanthelasma of left eye, unspecified eyelid: Secondary | ICD-10-CM | POA: Diagnosis not present

## 2019-02-23 DIAGNOSIS — E782 Mixed hyperlipidemia: Secondary | ICD-10-CM | POA: Diagnosis not present

## 2019-02-23 DIAGNOSIS — R7301 Impaired fasting glucose: Secondary | ICD-10-CM | POA: Diagnosis not present

## 2019-02-23 DIAGNOSIS — Z23 Encounter for immunization: Secondary | ICD-10-CM | POA: Diagnosis not present

## 2019-02-23 DIAGNOSIS — I10 Essential (primary) hypertension: Secondary | ICD-10-CM | POA: Diagnosis not present

## 2019-02-23 DIAGNOSIS — M810 Age-related osteoporosis without current pathological fracture: Secondary | ICD-10-CM | POA: Diagnosis not present

## 2019-02-23 DIAGNOSIS — Z0001 Encounter for general adult medical examination with abnormal findings: Secondary | ICD-10-CM | POA: Diagnosis not present

## 2019-02-23 DIAGNOSIS — E039 Hypothyroidism, unspecified: Secondary | ICD-10-CM | POA: Diagnosis not present

## 2019-03-08 ENCOUNTER — Other Ambulatory Visit: Payer: Self-pay

## 2019-04-18 DIAGNOSIS — C44300 Unspecified malignant neoplasm of skin of unspecified part of face: Secondary | ICD-10-CM | POA: Diagnosis not present

## 2019-04-18 DIAGNOSIS — R7301 Impaired fasting glucose: Secondary | ICD-10-CM | POA: Diagnosis not present

## 2019-04-18 DIAGNOSIS — I739 Peripheral vascular disease, unspecified: Secondary | ICD-10-CM | POA: Diagnosis not present

## 2019-04-18 DIAGNOSIS — M810 Age-related osteoporosis without current pathological fracture: Secondary | ICD-10-CM | POA: Diagnosis not present

## 2019-04-18 DIAGNOSIS — E039 Hypothyroidism, unspecified: Secondary | ICD-10-CM | POA: Diagnosis not present

## 2019-04-18 DIAGNOSIS — Z23 Encounter for immunization: Secondary | ICD-10-CM | POA: Diagnosis not present

## 2019-04-18 DIAGNOSIS — I10 Essential (primary) hypertension: Secondary | ICD-10-CM | POA: Diagnosis not present

## 2019-04-18 DIAGNOSIS — Z0001 Encounter for general adult medical examination with abnormal findings: Secondary | ICD-10-CM | POA: Diagnosis not present

## 2019-04-18 DIAGNOSIS — E7849 Other hyperlipidemia: Secondary | ICD-10-CM | POA: Diagnosis not present

## 2019-04-18 DIAGNOSIS — M1009 Idiopathic gout, multiple sites: Secondary | ICD-10-CM | POA: Diagnosis not present

## 2019-04-18 DIAGNOSIS — E782 Mixed hyperlipidemia: Secondary | ICD-10-CM | POA: Diagnosis not present

## 2019-04-18 DIAGNOSIS — K219 Gastro-esophageal reflux disease without esophagitis: Secondary | ICD-10-CM | POA: Diagnosis not present

## 2019-06-15 DIAGNOSIS — M1009 Idiopathic gout, multiple sites: Secondary | ICD-10-CM | POA: Diagnosis not present

## 2019-06-15 DIAGNOSIS — E039 Hypothyroidism, unspecified: Secondary | ICD-10-CM | POA: Diagnosis not present

## 2019-06-15 DIAGNOSIS — E782 Mixed hyperlipidemia: Secondary | ICD-10-CM | POA: Diagnosis not present

## 2019-06-15 DIAGNOSIS — I10 Essential (primary) hypertension: Secondary | ICD-10-CM | POA: Diagnosis not present

## 2019-06-20 DIAGNOSIS — I10 Essential (primary) hypertension: Secondary | ICD-10-CM | POA: Diagnosis not present

## 2019-06-20 DIAGNOSIS — J069 Acute upper respiratory infection, unspecified: Secondary | ICD-10-CM | POA: Diagnosis not present

## 2019-06-20 DIAGNOSIS — M810 Age-related osteoporosis without current pathological fracture: Secondary | ICD-10-CM | POA: Diagnosis not present

## 2019-06-20 DIAGNOSIS — R7301 Impaired fasting glucose: Secondary | ICD-10-CM | POA: Diagnosis not present

## 2019-06-20 DIAGNOSIS — Z23 Encounter for immunization: Secondary | ICD-10-CM | POA: Diagnosis not present

## 2019-06-20 DIAGNOSIS — I739 Peripheral vascular disease, unspecified: Secondary | ICD-10-CM | POA: Diagnosis not present

## 2019-06-20 DIAGNOSIS — E039 Hypothyroidism, unspecified: Secondary | ICD-10-CM | POA: Diagnosis not present

## 2019-06-20 DIAGNOSIS — F411 Generalized anxiety disorder: Secondary | ICD-10-CM | POA: Diagnosis not present

## 2019-06-20 DIAGNOSIS — J019 Acute sinusitis, unspecified: Secondary | ICD-10-CM | POA: Diagnosis not present

## 2019-06-20 DIAGNOSIS — H0266 Xanthelasma of left eye, unspecified eyelid: Secondary | ICD-10-CM | POA: Diagnosis not present

## 2019-06-20 DIAGNOSIS — E782 Mixed hyperlipidemia: Secondary | ICD-10-CM | POA: Diagnosis not present

## 2019-06-20 DIAGNOSIS — K219 Gastro-esophageal reflux disease without esophagitis: Secondary | ICD-10-CM | POA: Diagnosis not present

## 2019-06-23 DIAGNOSIS — I739 Peripheral vascular disease, unspecified: Secondary | ICD-10-CM | POA: Diagnosis not present

## 2019-06-23 DIAGNOSIS — C44300 Unspecified malignant neoplasm of skin of unspecified part of face: Secondary | ICD-10-CM | POA: Diagnosis not present

## 2019-06-23 DIAGNOSIS — R7301 Impaired fasting glucose: Secondary | ICD-10-CM | POA: Diagnosis not present

## 2019-06-23 DIAGNOSIS — H0266 Xanthelasma of left eye, unspecified eyelid: Secondary | ICD-10-CM | POA: Diagnosis not present

## 2019-06-23 DIAGNOSIS — M542 Cervicalgia: Secondary | ICD-10-CM | POA: Diagnosis not present

## 2019-06-23 DIAGNOSIS — E039 Hypothyroidism, unspecified: Secondary | ICD-10-CM | POA: Diagnosis not present

## 2019-06-23 DIAGNOSIS — M810 Age-related osteoporosis without current pathological fracture: Secondary | ICD-10-CM | POA: Diagnosis not present

## 2019-06-23 DIAGNOSIS — M1009 Idiopathic gout, multiple sites: Secondary | ICD-10-CM | POA: Diagnosis not present

## 2019-06-23 DIAGNOSIS — I10 Essential (primary) hypertension: Secondary | ICD-10-CM | POA: Diagnosis not present

## 2019-06-23 DIAGNOSIS — K219 Gastro-esophageal reflux disease without esophagitis: Secondary | ICD-10-CM | POA: Diagnosis not present

## 2019-06-23 DIAGNOSIS — E782 Mixed hyperlipidemia: Secondary | ICD-10-CM | POA: Diagnosis not present

## 2019-09-05 DIAGNOSIS — E782 Mixed hyperlipidemia: Secondary | ICD-10-CM | POA: Diagnosis not present

## 2019-09-05 DIAGNOSIS — K219 Gastro-esophageal reflux disease without esophagitis: Secondary | ICD-10-CM | POA: Diagnosis not present

## 2019-09-05 DIAGNOSIS — M542 Cervicalgia: Secondary | ICD-10-CM | POA: Diagnosis not present

## 2019-09-05 DIAGNOSIS — R7301 Impaired fasting glucose: Secondary | ICD-10-CM | POA: Diagnosis not present

## 2019-09-05 DIAGNOSIS — M1009 Idiopathic gout, multiple sites: Secondary | ICD-10-CM | POA: Diagnosis not present

## 2019-09-05 DIAGNOSIS — M810 Age-related osteoporosis without current pathological fracture: Secondary | ICD-10-CM | POA: Diagnosis not present

## 2019-09-05 DIAGNOSIS — H0266 Xanthelasma of left eye, unspecified eyelid: Secondary | ICD-10-CM | POA: Diagnosis not present

## 2019-09-05 DIAGNOSIS — I739 Peripheral vascular disease, unspecified: Secondary | ICD-10-CM | POA: Diagnosis not present

## 2019-09-05 DIAGNOSIS — E039 Hypothyroidism, unspecified: Secondary | ICD-10-CM | POA: Diagnosis not present

## 2019-09-05 DIAGNOSIS — I10 Essential (primary) hypertension: Secondary | ICD-10-CM | POA: Diagnosis not present

## 2019-09-05 DIAGNOSIS — C44300 Unspecified malignant neoplasm of skin of unspecified part of face: Secondary | ICD-10-CM | POA: Diagnosis not present

## 2019-10-19 DIAGNOSIS — K219 Gastro-esophageal reflux disease without esophagitis: Secondary | ICD-10-CM | POA: Diagnosis not present

## 2019-10-19 DIAGNOSIS — I739 Peripheral vascular disease, unspecified: Secondary | ICD-10-CM | POA: Diagnosis not present

## 2019-10-19 DIAGNOSIS — M810 Age-related osteoporosis without current pathological fracture: Secondary | ICD-10-CM | POA: Diagnosis not present

## 2019-10-19 DIAGNOSIS — C44300 Unspecified malignant neoplasm of skin of unspecified part of face: Secondary | ICD-10-CM | POA: Diagnosis not present

## 2019-10-19 DIAGNOSIS — H0266 Xanthelasma of left eye, unspecified eyelid: Secondary | ICD-10-CM | POA: Diagnosis not present

## 2019-10-19 DIAGNOSIS — M1009 Idiopathic gout, multiple sites: Secondary | ICD-10-CM | POA: Diagnosis not present

## 2019-10-19 DIAGNOSIS — E782 Mixed hyperlipidemia: Secondary | ICD-10-CM | POA: Diagnosis not present

## 2019-10-19 DIAGNOSIS — E039 Hypothyroidism, unspecified: Secondary | ICD-10-CM | POA: Diagnosis not present

## 2019-10-19 DIAGNOSIS — M542 Cervicalgia: Secondary | ICD-10-CM | POA: Diagnosis not present

## 2019-10-19 DIAGNOSIS — I10 Essential (primary) hypertension: Secondary | ICD-10-CM | POA: Diagnosis not present

## 2019-10-19 DIAGNOSIS — R7301 Impaired fasting glucose: Secondary | ICD-10-CM | POA: Diagnosis not present

## 2019-10-21 DIAGNOSIS — T6591XA Toxic effect of unspecified substance, accidental (unintentional), initial encounter: Secondary | ICD-10-CM | POA: Diagnosis not present

## 2019-11-08 ENCOUNTER — Other Ambulatory Visit: Payer: Self-pay

## 2019-12-06 DIAGNOSIS — G729 Myopathy, unspecified: Secondary | ICD-10-CM | POA: Diagnosis not present

## 2019-12-06 DIAGNOSIS — I10 Essential (primary) hypertension: Secondary | ICD-10-CM | POA: Diagnosis not present

## 2019-12-06 DIAGNOSIS — M1009 Idiopathic gout, multiple sites: Secondary | ICD-10-CM | POA: Diagnosis not present

## 2019-12-06 DIAGNOSIS — E039 Hypothyroidism, unspecified: Secondary | ICD-10-CM | POA: Diagnosis not present

## 2019-12-06 DIAGNOSIS — E7849 Other hyperlipidemia: Secondary | ICD-10-CM | POA: Diagnosis not present

## 2019-12-29 DIAGNOSIS — J069 Acute upper respiratory infection, unspecified: Secondary | ICD-10-CM | POA: Diagnosis not present

## 2019-12-29 DIAGNOSIS — E782 Mixed hyperlipidemia: Secondary | ICD-10-CM | POA: Diagnosis not present

## 2019-12-29 DIAGNOSIS — J019 Acute sinusitis, unspecified: Secondary | ICD-10-CM | POA: Diagnosis not present

## 2019-12-29 DIAGNOSIS — F411 Generalized anxiety disorder: Secondary | ICD-10-CM | POA: Diagnosis not present

## 2019-12-29 DIAGNOSIS — I10 Essential (primary) hypertension: Secondary | ICD-10-CM | POA: Diagnosis not present

## 2019-12-29 DIAGNOSIS — K219 Gastro-esophageal reflux disease without esophagitis: Secondary | ICD-10-CM | POA: Diagnosis not present

## 2019-12-29 DIAGNOSIS — M810 Age-related osteoporosis without current pathological fracture: Secondary | ICD-10-CM | POA: Diagnosis not present

## 2019-12-29 DIAGNOSIS — Z23 Encounter for immunization: Secondary | ICD-10-CM | POA: Diagnosis not present

## 2019-12-29 DIAGNOSIS — H0266 Xanthelasma of left eye, unspecified eyelid: Secondary | ICD-10-CM | POA: Diagnosis not present

## 2019-12-29 DIAGNOSIS — R7301 Impaired fasting glucose: Secondary | ICD-10-CM | POA: Diagnosis not present

## 2019-12-29 DIAGNOSIS — E039 Hypothyroidism, unspecified: Secondary | ICD-10-CM | POA: Diagnosis not present

## 2019-12-29 DIAGNOSIS — I739 Peripheral vascular disease, unspecified: Secondary | ICD-10-CM | POA: Diagnosis not present

## 2020-01-13 DIAGNOSIS — Z23 Encounter for immunization: Secondary | ICD-10-CM | POA: Diagnosis not present

## 2020-01-23 DIAGNOSIS — M542 Cervicalgia: Secondary | ICD-10-CM | POA: Diagnosis not present

## 2020-01-23 DIAGNOSIS — H0266 Xanthelasma of left eye, unspecified eyelid: Secondary | ICD-10-CM | POA: Diagnosis not present

## 2020-01-23 DIAGNOSIS — I10 Essential (primary) hypertension: Secondary | ICD-10-CM | POA: Diagnosis not present

## 2020-01-23 DIAGNOSIS — K219 Gastro-esophageal reflux disease without esophagitis: Secondary | ICD-10-CM | POA: Diagnosis not present

## 2020-01-23 DIAGNOSIS — E782 Mixed hyperlipidemia: Secondary | ICD-10-CM | POA: Diagnosis not present

## 2020-01-23 DIAGNOSIS — C44300 Unspecified malignant neoplasm of skin of unspecified part of face: Secondary | ICD-10-CM | POA: Diagnosis not present

## 2020-01-23 DIAGNOSIS — E039 Hypothyroidism, unspecified: Secondary | ICD-10-CM | POA: Diagnosis not present

## 2020-01-23 DIAGNOSIS — R7301 Impaired fasting glucose: Secondary | ICD-10-CM | POA: Diagnosis not present

## 2020-01-23 DIAGNOSIS — I739 Peripheral vascular disease, unspecified: Secondary | ICD-10-CM | POA: Diagnosis not present

## 2020-01-23 DIAGNOSIS — M1009 Idiopathic gout, multiple sites: Secondary | ICD-10-CM | POA: Diagnosis not present

## 2020-01-23 DIAGNOSIS — M810 Age-related osteoporosis without current pathological fracture: Secondary | ICD-10-CM | POA: Diagnosis not present

## 2020-02-08 DIAGNOSIS — M810 Age-related osteoporosis without current pathological fracture: Secondary | ICD-10-CM | POA: Diagnosis not present

## 2020-02-08 DIAGNOSIS — K219 Gastro-esophageal reflux disease without esophagitis: Secondary | ICD-10-CM | POA: Diagnosis not present

## 2020-02-08 DIAGNOSIS — E039 Hypothyroidism, unspecified: Secondary | ICD-10-CM | POA: Diagnosis not present

## 2020-02-08 DIAGNOSIS — M542 Cervicalgia: Secondary | ICD-10-CM | POA: Diagnosis not present

## 2020-02-08 DIAGNOSIS — M1009 Idiopathic gout, multiple sites: Secondary | ICD-10-CM | POA: Diagnosis not present

## 2020-02-08 DIAGNOSIS — E782 Mixed hyperlipidemia: Secondary | ICD-10-CM | POA: Diagnosis not present

## 2020-02-08 DIAGNOSIS — H0266 Xanthelasma of left eye, unspecified eyelid: Secondary | ICD-10-CM | POA: Diagnosis not present

## 2020-02-08 DIAGNOSIS — R7301 Impaired fasting glucose: Secondary | ICD-10-CM | POA: Diagnosis not present

## 2020-02-08 DIAGNOSIS — I10 Essential (primary) hypertension: Secondary | ICD-10-CM | POA: Diagnosis not present

## 2020-02-08 DIAGNOSIS — C44300 Unspecified malignant neoplasm of skin of unspecified part of face: Secondary | ICD-10-CM | POA: Diagnosis not present

## 2020-02-08 DIAGNOSIS — I739 Peripheral vascular disease, unspecified: Secondary | ICD-10-CM | POA: Diagnosis not present

## 2020-04-13 DIAGNOSIS — M1009 Idiopathic gout, multiple sites: Secondary | ICD-10-CM | POA: Diagnosis not present

## 2020-04-13 DIAGNOSIS — C44300 Unspecified malignant neoplasm of skin of unspecified part of face: Secondary | ICD-10-CM | POA: Diagnosis not present

## 2020-04-13 DIAGNOSIS — M542 Cervicalgia: Secondary | ICD-10-CM | POA: Diagnosis not present

## 2020-04-13 DIAGNOSIS — M810 Age-related osteoporosis without current pathological fracture: Secondary | ICD-10-CM | POA: Diagnosis not present

## 2020-04-13 DIAGNOSIS — I739 Peripheral vascular disease, unspecified: Secondary | ICD-10-CM | POA: Diagnosis not present

## 2020-04-13 DIAGNOSIS — I10 Essential (primary) hypertension: Secondary | ICD-10-CM | POA: Diagnosis not present

## 2020-04-13 DIAGNOSIS — K219 Gastro-esophageal reflux disease without esophagitis: Secondary | ICD-10-CM | POA: Diagnosis not present

## 2020-04-13 DIAGNOSIS — E782 Mixed hyperlipidemia: Secondary | ICD-10-CM | POA: Diagnosis not present

## 2020-04-13 DIAGNOSIS — R7301 Impaired fasting glucose: Secondary | ICD-10-CM | POA: Diagnosis not present

## 2020-04-13 DIAGNOSIS — E039 Hypothyroidism, unspecified: Secondary | ICD-10-CM | POA: Diagnosis not present

## 2020-04-13 DIAGNOSIS — H0266 Xanthelasma of left eye, unspecified eyelid: Secondary | ICD-10-CM | POA: Diagnosis not present

## 2020-04-27 ENCOUNTER — Ambulatory Visit
Admission: EM | Admit: 2020-04-27 | Discharge: 2020-04-27 | Disposition: A | Discharge status: Home / Self Care | Payer: PPO

## 2020-04-27 ENCOUNTER — Other Ambulatory Visit: Payer: Self-pay

## 2020-04-27 ENCOUNTER — Encounter: Payer: Self-pay | Admitting: Emergency Medicine

## 2020-04-27 DIAGNOSIS — J0100 Acute maxillary sinusitis, unspecified: Secondary | ICD-10-CM

## 2020-04-27 HISTORY — DX: Unspecified osteoarthritis, unspecified site: M19.90

## 2020-04-27 HISTORY — DX: Essential (primary) hypertension: I10

## 2020-04-27 HISTORY — DX: Disorder of thyroid, unspecified: E07.9

## 2020-04-27 MED ORDER — DEXAMETHASONE 4 MG PO TABS: 4.0000 mg | ORAL_TABLET | Freq: Every day | ORAL | 0 refills | Status: AC

## 2020-04-27 MED ORDER — DOXYCYCLINE HYCLATE 100 MG PO CAPS: 100.0000 mg | ORAL_CAPSULE | Freq: Two times a day (BID) | ORAL | 0 refills | Status: DC

## 2020-04-27 NOTE — Discharge Instructions (Addendum)
Rest and drink plenty of fluids Prescribed doxycycline  Prescribed Decadron Take medications as directed and to completion Continue to use OTC ibuprofen and/ or tylenol as needed for pain control Follow up with PCP if symptoms persists Return here or go to the ER if you have any new or worsening symptoms

## 2020-04-27 NOTE — ED Provider Notes (Signed)
[facility]   [id number] 04/27/20 Arrival Time: 8502  CC:EAR PAIN  SUBJECTIVE: History from: patient.  Toni Gibson is a 75 y.o. female who presented to the urgent care for complaint of left ear pain and left sinus pressure and pain for the past 1 week.  Reported yellowish and bloody nasal sputum.  Denies a precipitating event, such as swimming or wearing ear plugs.  Patient states the pain is constant and achy in character.  Has tried OTC Mucinex sinus without relief.  Denies alleviating or aggravating  factor.  Report similar symptoms in the past that improved with doxycycline.     Denies fever, chills, fatigue, rhinorrhea, ear discharge, sore throat, SOB, wheezing, chest pain, nausea, changes in bowel or bladder habits.    ROS: As per HPI.  All other pertinent ROS negative.      Past Medical History:  Diagnosis Date  . Arthritis   . Hypertension   . Thyroid disease    History reviewed. No pertinent surgical history. Allergies  Allergen Reactions  . Codeine   . Sulfa Antibiotics   . Zithromax [Azithromycin]    No current facility-administered medications on file prior to encounter.   Current Outpatient Medications on File Prior to Encounter  Medication Sig Dispense Refill  . allopurinol (ZYLOPRIM) 300 MG tablet Take 300 mg by mouth daily.    Marland Kitchen aspirin EC 81 MG tablet Take 81 mg by mouth daily. Swallow whole.    . levothyroxine (SYNTHROID) 25 MCG tablet Take 25 mcg by mouth daily before breakfast.    . lisinopril (ZESTRIL) 20 MG tablet Take 20 mg by mouth daily.    . pantoprazole (PROTONIX) 40 MG tablet Take 40 mg by mouth daily.     Social History   Socioeconomic History  . Marital status: Married    Spouse name: Not on file  . Number of children: Not on file  . Years of education: Not on file  . Highest education level: Not on file  Occupational History  . Not on file  Tobacco Use  . Smoking status: Former Research scientist (life sciences)  . Smokeless tobacco: Never Used   Vaping Use  . Vaping Use: Never used  Substance and Sexual Activity  . Alcohol use: Never  . Drug use: Never  . Sexual activity: Not on file  Other Topics Concern  . Not on file  Social History Narrative  . Not on file   Social Determinants of Health   Financial Resource Strain: Not on file  Food Insecurity: Not on file  Transportation Needs: Not on file  Physical Activity: Not on file  Stress: Not on file  Social Connections: Not on file  Intimate Partner Violence: Not on file   History reviewed. No pertinent family history.  OBJECTIVE:  Vitals:   04/27/20 0909 04/27/20 0916  BP:  (!) 164/93  Pulse:  (!) 107  Resp:  18  Temp:  97.7 F (36.5 C)  TempSrc:  Oral  SpO2:  96%  Weight: 168 lb (76.2 kg)   Height: 5\' 6"  (1.676 m)      Physical Exam Vitals and nursing note reviewed.  Constitutional:      General: She is not in acute distress.    Appearance: Normal appearance. She is normal weight. She is not ill-appearing, toxic-appearing or diaphoretic.  HENT:     Head: Normocephalic.     Left Ear: Tympanic membrane, ear canal and external ear normal. There is no impacted cerumen.  Nose: Congestion present.     Left Sinus: Maxillary sinus tenderness present.     Mouth/Throat:     Mouth: Mucous membranes are moist.     Pharynx: Oropharynx is clear. No oropharyngeal exudate.  Cardiovascular:     Rate and Rhythm: Normal rate and regular rhythm.     Pulses: Normal pulses.     Heart sounds: Normal heart sounds. No murmur heard. No friction rub. No gallop.   Pulmonary:     Effort: Pulmonary effort is normal. No respiratory distress.     Breath sounds: Normal breath sounds. No stridor. No wheezing, rhonchi or rales.  Chest:     Chest wall: No tenderness.  Neurological:     Mental Status: She is alert and oriented to person, place, and time.     Imaging: No results found.   ASSESSMENT & PLAN:  1. Acute non-recurrent maxillary sinusitis     Meds ordered  this encounter  Medications  . doxycycline (VIBRAMYCIN) 100 MG capsule    Sig: Take 1 capsule (100 mg total) by mouth 2 (two) times daily.    Dispense:  20 capsule    Refill:  0  . dexamethasone (DECADRON) 4 MG tablet    Sig: Take 1 tablet (4 mg total) by mouth daily for 7 days.    Dispense:  7 tablet    Refill:  0     Discharge Instructions  Rest and drink plenty of fluids Prescribed doxycycline  Prescribed Decadron Take medications as directed and to completion Continue to use OTC ibuprofen and/ or tylenol as needed for pain control Follow up with PCP if symptoms persists Return here or go to the ER if you have any new or worsening symptoms   Reviewed expectations re: course of current medical issues. Questions answered. Outlined signs and symptoms indicating need for more acute intervention. Patient verbalized understanding. After Visit Summary given.         Emerson Monte, [place] 04/27/20 [phone]

## 2020-04-27 NOTE — ED Triage Notes (Signed)
Sinus pressure on left side and left ear pain since last week

## 2020-06-11 DIAGNOSIS — M1009 Idiopathic gout, multiple sites: Secondary | ICD-10-CM | POA: Diagnosis not present

## 2020-06-11 DIAGNOSIS — G729 Myopathy, unspecified: Secondary | ICD-10-CM | POA: Diagnosis not present

## 2020-06-11 DIAGNOSIS — I10 Essential (primary) hypertension: Secondary | ICD-10-CM | POA: Diagnosis not present

## 2020-06-11 DIAGNOSIS — E039 Hypothyroidism, unspecified: Secondary | ICD-10-CM | POA: Diagnosis not present

## 2020-06-11 DIAGNOSIS — E782 Mixed hyperlipidemia: Secondary | ICD-10-CM | POA: Diagnosis not present

## 2020-06-28 DIAGNOSIS — H40003 Preglaucoma, unspecified, bilateral: Secondary | ICD-10-CM | POA: Diagnosis not present

## 2020-07-25 DIAGNOSIS — M810 Age-related osteoporosis without current pathological fracture: Secondary | ICD-10-CM | POA: Diagnosis not present

## 2020-07-25 DIAGNOSIS — R7301 Impaired fasting glucose: Secondary | ICD-10-CM | POA: Diagnosis not present

## 2020-07-25 DIAGNOSIS — E039 Hypothyroidism, unspecified: Secondary | ICD-10-CM | POA: Diagnosis not present

## 2020-07-25 DIAGNOSIS — E7849 Other hyperlipidemia: Secondary | ICD-10-CM | POA: Diagnosis not present

## 2020-07-25 DIAGNOSIS — I10 Essential (primary) hypertension: Secondary | ICD-10-CM | POA: Diagnosis not present

## 2020-07-25 DIAGNOSIS — E782 Mixed hyperlipidemia: Secondary | ICD-10-CM | POA: Diagnosis not present

## 2020-07-25 DIAGNOSIS — M150 Primary generalized (osteo)arthritis: Secondary | ICD-10-CM | POA: Diagnosis not present

## 2020-07-30 DIAGNOSIS — R7301 Impaired fasting glucose: Secondary | ICD-10-CM | POA: Diagnosis not present

## 2020-07-30 DIAGNOSIS — M542 Cervicalgia: Secondary | ICD-10-CM | POA: Diagnosis not present

## 2020-07-30 DIAGNOSIS — K219 Gastro-esophageal reflux disease without esophagitis: Secondary | ICD-10-CM | POA: Diagnosis not present

## 2020-07-30 DIAGNOSIS — M810 Age-related osteoporosis without current pathological fracture: Secondary | ICD-10-CM | POA: Diagnosis not present

## 2020-07-30 DIAGNOSIS — I739 Peripheral vascular disease, unspecified: Secondary | ICD-10-CM | POA: Diagnosis not present

## 2020-07-30 DIAGNOSIS — H0266 Xanthelasma of left eye, unspecified eyelid: Secondary | ICD-10-CM | POA: Diagnosis not present

## 2020-07-30 DIAGNOSIS — M1009 Idiopathic gout, multiple sites: Secondary | ICD-10-CM | POA: Diagnosis not present

## 2020-07-30 DIAGNOSIS — C44300 Unspecified malignant neoplasm of skin of unspecified part of face: Secondary | ICD-10-CM | POA: Diagnosis not present

## 2020-07-30 DIAGNOSIS — I10 Essential (primary) hypertension: Secondary | ICD-10-CM | POA: Diagnosis not present

## 2020-07-30 DIAGNOSIS — M25512 Pain in left shoulder: Secondary | ICD-10-CM | POA: Diagnosis not present

## 2020-07-30 DIAGNOSIS — E782 Mixed hyperlipidemia: Secondary | ICD-10-CM | POA: Diagnosis not present

## 2020-07-30 DIAGNOSIS — E039 Hypothyroidism, unspecified: Secondary | ICD-10-CM | POA: Diagnosis not present

## 2020-09-17 DIAGNOSIS — N390 Urinary tract infection, site not specified: Secondary | ICD-10-CM | POA: Diagnosis not present

## 2020-09-17 DIAGNOSIS — L259 Unspecified contact dermatitis, unspecified cause: Secondary | ICD-10-CM | POA: Diagnosis not present

## 2020-12-24 DIAGNOSIS — U071 COVID-19: Secondary | ICD-10-CM | POA: Diagnosis not present

## 2021-01-31 DIAGNOSIS — Z23 Encounter for immunization: Secondary | ICD-10-CM | POA: Diagnosis not present

## 2021-02-08 DIAGNOSIS — E782 Mixed hyperlipidemia: Secondary | ICD-10-CM | POA: Diagnosis not present

## 2021-02-08 DIAGNOSIS — I10 Essential (primary) hypertension: Secondary | ICD-10-CM | POA: Diagnosis not present

## 2021-02-08 DIAGNOSIS — E039 Hypothyroidism, unspecified: Secondary | ICD-10-CM | POA: Diagnosis not present

## 2021-02-11 DIAGNOSIS — E785 Hyperlipidemia, unspecified: Secondary | ICD-10-CM | POA: Diagnosis not present

## 2021-02-11 DIAGNOSIS — I10 Essential (primary) hypertension: Secondary | ICD-10-CM | POA: Diagnosis not present

## 2021-02-12 DIAGNOSIS — R7301 Impaired fasting glucose: Secondary | ICD-10-CM | POA: Diagnosis not present

## 2021-02-12 DIAGNOSIS — I739 Peripheral vascular disease, unspecified: Secondary | ICD-10-CM | POA: Diagnosis not present

## 2021-02-12 DIAGNOSIS — M542 Cervicalgia: Secondary | ICD-10-CM | POA: Diagnosis not present

## 2021-02-12 DIAGNOSIS — M109 Gout, unspecified: Secondary | ICD-10-CM | POA: Diagnosis not present

## 2021-02-12 DIAGNOSIS — M810 Age-related osteoporosis without current pathological fracture: Secondary | ICD-10-CM | POA: Diagnosis not present

## 2021-02-12 DIAGNOSIS — K219 Gastro-esophageal reflux disease without esophagitis: Secondary | ICD-10-CM | POA: Diagnosis not present

## 2021-02-12 DIAGNOSIS — Z0001 Encounter for general adult medical examination with abnormal findings: Secondary | ICD-10-CM | POA: Diagnosis not present

## 2021-02-12 DIAGNOSIS — E782 Mixed hyperlipidemia: Secondary | ICD-10-CM | POA: Diagnosis not present

## 2021-02-12 DIAGNOSIS — I10 Essential (primary) hypertension: Secondary | ICD-10-CM | POA: Diagnosis not present

## 2021-02-12 DIAGNOSIS — R7401 Elevation of levels of liver transaminase levels: Secondary | ICD-10-CM | POA: Diagnosis not present

## 2021-02-12 DIAGNOSIS — Z85828 Personal history of other malignant neoplasm of skin: Secondary | ICD-10-CM | POA: Diagnosis not present

## 2021-02-12 DIAGNOSIS — E039 Hypothyroidism, unspecified: Secondary | ICD-10-CM | POA: Diagnosis not present

## 2021-02-13 ENCOUNTER — Other Ambulatory Visit (HOSPITAL_COMMUNITY): Payer: Self-pay | Admitting: Internal Medicine

## 2021-02-13 DIAGNOSIS — M810 Age-related osteoporosis without current pathological fracture: Secondary | ICD-10-CM

## 2021-02-20 ENCOUNTER — Other Ambulatory Visit: Payer: Self-pay

## 2021-02-20 ENCOUNTER — Ambulatory Visit (HOSPITAL_COMMUNITY)
Admission: RE | Admit: 2021-02-20 | Discharge: 2021-02-20 | Disposition: A | Discharge status: Home / Self Care | Payer: PPO | Source: Ambulatory Visit | Attending: Internal Medicine | Admitting: Internal Medicine

## 2021-02-20 DIAGNOSIS — M810 Age-related osteoporosis without current pathological fracture: Secondary | ICD-10-CM | POA: Diagnosis not present

## 2021-02-20 DIAGNOSIS — Z780 Asymptomatic menopausal state: Secondary | ICD-10-CM | POA: Diagnosis not present

## 2021-02-20 DIAGNOSIS — M85832 Other specified disorders of bone density and structure, left forearm: Secondary | ICD-10-CM | POA: Diagnosis not present

## 2021-03-13 DIAGNOSIS — E785 Hyperlipidemia, unspecified: Secondary | ICD-10-CM | POA: Diagnosis not present

## 2021-03-13 DIAGNOSIS — I10 Essential (primary) hypertension: Secondary | ICD-10-CM | POA: Diagnosis not present

## 2021-05-14 DIAGNOSIS — I10 Essential (primary) hypertension: Secondary | ICD-10-CM | POA: Diagnosis not present

## 2021-05-14 DIAGNOSIS — E785 Hyperlipidemia, unspecified: Secondary | ICD-10-CM | POA: Diagnosis not present

## 2021-06-25 DIAGNOSIS — R3914 Feeling of incomplete bladder emptying: Secondary | ICD-10-CM | POA: Diagnosis not present

## 2021-06-25 DIAGNOSIS — R300 Dysuria: Secondary | ICD-10-CM | POA: Diagnosis not present

## 2021-06-25 DIAGNOSIS — N390 Urinary tract infection, site not specified: Secondary | ICD-10-CM | POA: Diagnosis not present

## 2021-08-25 ENCOUNTER — Ambulatory Visit
Admission: RE | Admit: 2021-08-25 | Discharge: 2021-08-25 | Disposition: A | Discharge status: Home / Self Care | Payer: PPO | Source: Ambulatory Visit | Attending: Internal Medicine | Admitting: Internal Medicine

## 2021-08-25 ENCOUNTER — Ambulatory Visit: Payer: Self-pay

## 2021-08-25 VITALS — BP 173/103 | HR 96 | Temp 98.0°F | Resp 18

## 2021-08-25 DIAGNOSIS — R21 Rash and other nonspecific skin eruption: Secondary | ICD-10-CM | POA: Diagnosis not present

## 2021-08-25 MED ORDER — CLOTRIMAZOLE-BETAMETHASONE 1-0.05 % EX CREA: TOPICAL_CREAM | CUTANEOUS | 0 refills | Status: AC

## 2021-08-25 NOTE — Discharge Instructions (Addendum)
He is apply the lotion to the affected area ?If you notice worsening of the rash i.e. spreading of the rash or redness please return to the urgent care to be reevaluated. ?

## 2021-08-25 NOTE — ED Triage Notes (Signed)
Rash on lower left leg that itches and burns since Friday. Hx of this rash before. She can't remember the medications she was given to treat the rash.   ?

## 2021-08-26 NOTE — ED Provider Notes (Signed)
?[facility] ? ? ? ?CSN: [id number] ?Arrival date & time: 08/25/21  1042 ? ? ?  ? ?History   ?Chief Complaint ?Chief Complaint  ?Patient presents with  ? Rash  ?  On leg - Entered by patient  ? ? ?HPI ?Toni Gibson is a 76 y.o. female comes to the urgent care with 3-day history of itchy burning rash over the left leg.  Onset of rash was insidious and has been persistent.  Patient denies any change in cosmetics, body lotion or soaps.  No fever or chills.  Area involved in the rash has been stable since Friday.  No swelling or discharge.  No trauma to the area. ?HPI ? ?Past Medical History:  ?Diagnosis Date  ? Arthritis   ? Hypertension   ? Thyroid disease   ? ? ?There are no problems to display for this patient. ? ? ?History reviewed. No pertinent surgical history. ? ?OB History   ?No obstetric history on file. ?  ? ? ? ?Home Medications   ? ?Prior to Admission medications   ?Medication Sig Start Date End Date Taking? Authorizing Provider  ?clotrimazole-betamethasone (LOTRISONE) cream Apply to affected area 2 times daily prn 08/25/21  Yes Fitzroy Mikami, Myrene Galas, MD  ?allopurinol (ZYLOPRIM) 300 MG tablet Take 300 mg by mouth daily.    [provider]  ?aspirin EC 81 MG tablet Take 81 mg by mouth daily. Swallow whole.    [provider]  ?levothyroxine (SYNTHROID) 25 MCG tablet Take 25 mcg by mouth daily before breakfast.    [provider]  ?lisinopril (ZESTRIL) 20 MG tablet Take 20 mg by mouth daily.    [provider]  ?pantoprazole (PROTONIX) 40 MG tablet Take 40 mg by mouth daily.    [provider]  ? ? ?Family History ?History reviewed. No pertinent family history. ? ?Social History ?Social History  ? ?Tobacco Use  ? Smoking status: Former  ? Smokeless tobacco: Never  ?Vaping Use  ? Vaping Use: Never used  ?Substance Use Topics  ? Alcohol use: Never  ? Drug use: Never  ? ? ? ?Allergies   ?Codeine, Sulfa antibiotics, and Zithromax [azithromycin] ? ? ?Review of  Systems ?Review of Systems  ?Constitutional: Negative.   ?Skin:  Positive for color change and rash. Negative for pallor and wound.  ? ? ?Physical Exam ?Triage Vital Signs ?ED Triage Vitals [08/25/21 1103]  ?Enc Vitals Group  ?   BP (!) 173/103  ?   Pulse Rate 96  ?   Resp 18  ?   Temp 98 ?F (36.7 ?C)  ?   Temp Source Oral  ?   SpO2 99 %  ?   Weight   ?   Height   ?   Head Circumference   ?   Peak Flow   ?   Pain Score   ?   Pain Loc   ?   Pain Edu?   ?   Excl. in [place]?   ? ?No data found. ? ?Updated Vital Signs ?BP (!) 173/103 (BP Location: Right Arm)   Pulse 96   Temp 98 ?F (36.7 ?C) (Oral)   Resp 18   SpO2 99%  ? ?Visual Acuity ?Right Eye Distance:   ?Left Eye Distance:   ?Bilateral Distance:   ? ?Right Eye Near:   ?Left Eye Near:    ?Bilateral Near:    ? ?Physical Exam ?Vitals and nursing note reviewed.  ?Constitutional:   ?  General: She is not in acute distress. ?   Appearance: She is not ill-appearing.  ?Cardiovascular:  ?   Rate and Rhythm: Normal rate and regular rhythm.  ?Musculoskeletal:     ?   General: No swelling or tenderness. Normal range of motion.  ?Skin: ?   General: Skin is warm.  ?   Findings: Erythema and rash present.  ?   Comments: Well-circumscribed area with mild erythema and a few papular lesions.  Nontender to palpation.  Full range of motion of the knee.  The rash is distal to the tibial tubercle of the left leg.  ?Neurological:  ?   Mental Status: She is alert.  ? ? ? ?UC Treatments / Results  ?Labs ?(all labs ordered are listed, but only abnormal results are displayed) ?Labs Reviewed - No data to display ? ?EKG ? ? ?Radiology ?No results found. ? ?Procedures ?Procedures (including critical care time) ? ?Medications Ordered in UC ?Medications - No data to display ? ?Initial Impression / Assessment and Plan / UC Course  ?I have reviewed the triage vital signs and the nursing notes. ? ?Pertinent labs & imaging results that were available during my care of the patient were reviewed by  me and considered in my medical decision making (see chart for details). ? ?  ? ?Rash over left leg: ?Lotrisone cream ?No indication for antibiotics since the rash does not look cellulitic ?Return precautions given to patient ?Tylenol as needed for pain. ?Final Clinical Impressions(s) / UC Diagnoses  ? ?Final diagnoses:  ?Rash in adult  ? ? ? ?Discharge Instructions   ? ?  ?He is apply the lotion to the affected area ?If you notice worsening of the rash i.e. spreading of the rash or redness please return to the urgent care to be reevaluated. ? ? ?ED Prescriptions   ? ? Medication Sig Dispense Auth. Provider  ? clotrimazole-betamethasone (LOTRISONE) cream Apply to affected area 2 times daily prn 15 g Valon Glasscock, Myrene Galas, MD  ? ?  ? ?PDMP not reviewed this encounter. ?  ?Chase Picket, MD ?08/26/21 [phone] ? ?

## 2021-09-24 DIAGNOSIS — E782 Mixed hyperlipidemia: Secondary | ICD-10-CM | POA: Diagnosis not present

## 2021-09-24 DIAGNOSIS — E039 Hypothyroidism, unspecified: Secondary | ICD-10-CM | POA: Diagnosis not present

## 2021-09-24 DIAGNOSIS — R7301 Impaired fasting glucose: Secondary | ICD-10-CM | POA: Diagnosis not present

## 2021-10-01 DIAGNOSIS — M109 Gout, unspecified: Secondary | ICD-10-CM | POA: Diagnosis not present

## 2021-10-01 DIAGNOSIS — E039 Hypothyroidism, unspecified: Secondary | ICD-10-CM | POA: Diagnosis not present

## 2021-10-01 DIAGNOSIS — R350 Frequency of micturition: Secondary | ICD-10-CM | POA: Diagnosis not present

## 2021-10-01 DIAGNOSIS — M542 Cervicalgia: Secondary | ICD-10-CM | POA: Diagnosis not present

## 2021-10-01 DIAGNOSIS — I739 Peripheral vascular disease, unspecified: Secondary | ICD-10-CM | POA: Diagnosis not present

## 2021-10-01 DIAGNOSIS — K219 Gastro-esophageal reflux disease without esophagitis: Secondary | ICD-10-CM | POA: Diagnosis not present

## 2021-10-01 DIAGNOSIS — Z85828 Personal history of other malignant neoplasm of skin: Secondary | ICD-10-CM | POA: Diagnosis not present

## 2021-10-01 DIAGNOSIS — E782 Mixed hyperlipidemia: Secondary | ICD-10-CM | POA: Diagnosis not present

## 2021-10-01 DIAGNOSIS — R7301 Impaired fasting glucose: Secondary | ICD-10-CM | POA: Diagnosis not present

## 2021-10-01 DIAGNOSIS — I10 Essential (primary) hypertension: Secondary | ICD-10-CM | POA: Diagnosis not present

## 2021-10-01 DIAGNOSIS — M810 Age-related osteoporosis without current pathological fracture: Secondary | ICD-10-CM | POA: Diagnosis not present

## 2021-10-01 DIAGNOSIS — R7401 Elevation of levels of liver transaminase levels: Secondary | ICD-10-CM | POA: Diagnosis not present

## 2022-03-26 DIAGNOSIS — M810 Age-related osteoporosis without current pathological fracture: Secondary | ICD-10-CM | POA: Diagnosis not present

## 2022-03-26 DIAGNOSIS — E039 Hypothyroidism, unspecified: Secondary | ICD-10-CM | POA: Diagnosis not present

## 2022-03-26 DIAGNOSIS — E782 Mixed hyperlipidemia: Secondary | ICD-10-CM | POA: Diagnosis not present

## 2022-03-26 DIAGNOSIS — R7301 Impaired fasting glucose: Secondary | ICD-10-CM | POA: Diagnosis not present

## 2022-07-03 DIAGNOSIS — I10 Essential (primary) hypertension: Secondary | ICD-10-CM | POA: Diagnosis not present

## 2022-07-03 DIAGNOSIS — E782 Mixed hyperlipidemia: Secondary | ICD-10-CM | POA: Diagnosis not present

## 2022-07-03 DIAGNOSIS — E039 Hypothyroidism, unspecified: Secondary | ICD-10-CM | POA: Diagnosis not present

## 2022-07-03 DIAGNOSIS — H04123 Dry eye syndrome of bilateral lacrimal glands: Secondary | ICD-10-CM | POA: Diagnosis not present

## 2022-07-09 DIAGNOSIS — M542 Cervicalgia: Secondary | ICD-10-CM | POA: Diagnosis not present

## 2022-07-09 DIAGNOSIS — I739 Peripheral vascular disease, unspecified: Secondary | ICD-10-CM | POA: Diagnosis not present

## 2022-07-09 DIAGNOSIS — Z7189 Other specified counseling: Secondary | ICD-10-CM | POA: Diagnosis not present

## 2022-07-09 DIAGNOSIS — E782 Mixed hyperlipidemia: Secondary | ICD-10-CM | POA: Diagnosis not present

## 2022-07-09 DIAGNOSIS — E039 Hypothyroidism, unspecified: Secondary | ICD-10-CM | POA: Diagnosis not present

## 2022-07-09 DIAGNOSIS — M810 Age-related osteoporosis without current pathological fracture: Secondary | ICD-10-CM | POA: Diagnosis not present

## 2022-07-09 DIAGNOSIS — R7301 Impaired fasting glucose: Secondary | ICD-10-CM | POA: Diagnosis not present

## 2022-07-09 DIAGNOSIS — K219 Gastro-esophageal reflux disease without esophagitis: Secondary | ICD-10-CM | POA: Diagnosis not present

## 2022-07-09 DIAGNOSIS — M109 Gout, unspecified: Secondary | ICD-10-CM | POA: Diagnosis not present

## 2022-07-09 DIAGNOSIS — R7401 Elevation of levels of liver transaminase levels: Secondary | ICD-10-CM | POA: Diagnosis not present

## 2022-07-09 DIAGNOSIS — Z0001 Encounter for general adult medical examination with abnormal findings: Secondary | ICD-10-CM | POA: Diagnosis not present

## 2022-07-09 DIAGNOSIS — I10 Essential (primary) hypertension: Secondary | ICD-10-CM | POA: Diagnosis not present

## 2023-01-05 DIAGNOSIS — H04123 Dry eye syndrome of bilateral lacrimal glands: Secondary | ICD-10-CM | POA: Diagnosis not present

## 2023-01-06 DIAGNOSIS — R7301 Impaired fasting glucose: Secondary | ICD-10-CM | POA: Diagnosis not present

## 2023-01-06 DIAGNOSIS — M810 Age-related osteoporosis without current pathological fracture: Secondary | ICD-10-CM | POA: Diagnosis not present

## 2023-01-06 DIAGNOSIS — E782 Mixed hyperlipidemia: Secondary | ICD-10-CM | POA: Diagnosis not present

## 2023-01-06 DIAGNOSIS — E039 Hypothyroidism, unspecified: Secondary | ICD-10-CM | POA: Diagnosis not present

## 2023-01-12 DIAGNOSIS — M542 Cervicalgia: Secondary | ICD-10-CM | POA: Diagnosis not present

## 2023-01-12 DIAGNOSIS — Z85828 Personal history of other malignant neoplasm of skin: Secondary | ICD-10-CM | POA: Diagnosis not present

## 2023-01-12 DIAGNOSIS — M109 Gout, unspecified: Secondary | ICD-10-CM | POA: Diagnosis not present

## 2023-01-12 DIAGNOSIS — E785 Hyperlipidemia, unspecified: Secondary | ICD-10-CM | POA: Diagnosis not present

## 2023-01-12 DIAGNOSIS — R7301 Impaired fasting glucose: Secondary | ICD-10-CM | POA: Diagnosis not present

## 2023-01-12 DIAGNOSIS — M810 Age-related osteoporosis without current pathological fracture: Secondary | ICD-10-CM | POA: Diagnosis not present

## 2023-01-12 DIAGNOSIS — R7401 Elevation of levels of liver transaminase levels: Secondary | ICD-10-CM | POA: Diagnosis not present

## 2023-01-12 DIAGNOSIS — E782 Mixed hyperlipidemia: Secondary | ICD-10-CM | POA: Diagnosis not present

## 2023-01-12 DIAGNOSIS — I10 Essential (primary) hypertension: Secondary | ICD-10-CM | POA: Diagnosis not present

## 2023-01-12 DIAGNOSIS — I739 Peripheral vascular disease, unspecified: Secondary | ICD-10-CM | POA: Diagnosis not present

## 2023-01-12 DIAGNOSIS — E039 Hypothyroidism, unspecified: Secondary | ICD-10-CM | POA: Diagnosis not present

## 2023-01-12 DIAGNOSIS — K219 Gastro-esophageal reflux disease without esophagitis: Secondary | ICD-10-CM | POA: Diagnosis not present

## 2023-07-07 DIAGNOSIS — M810 Age-related osteoporosis without current pathological fracture: Secondary | ICD-10-CM | POA: Diagnosis not present

## 2023-07-07 DIAGNOSIS — R7301 Impaired fasting glucose: Secondary | ICD-10-CM | POA: Diagnosis not present

## 2023-07-07 DIAGNOSIS — M109 Gout, unspecified: Secondary | ICD-10-CM | POA: Diagnosis not present

## 2023-07-07 DIAGNOSIS — E782 Mixed hyperlipidemia: Secondary | ICD-10-CM | POA: Diagnosis not present

## 2023-07-07 DIAGNOSIS — E039 Hypothyroidism, unspecified: Secondary | ICD-10-CM | POA: Diagnosis not present

## 2023-07-13 DIAGNOSIS — M109 Gout, unspecified: Secondary | ICD-10-CM | POA: Diagnosis not present

## 2023-07-13 DIAGNOSIS — Z85828 Personal history of other malignant neoplasm of skin: Secondary | ICD-10-CM | POA: Diagnosis not present

## 2023-07-13 DIAGNOSIS — K219 Gastro-esophageal reflux disease without esophagitis: Secondary | ICD-10-CM | POA: Diagnosis not present

## 2023-07-13 DIAGNOSIS — M25512 Pain in left shoulder: Secondary | ICD-10-CM | POA: Diagnosis not present

## 2023-07-13 DIAGNOSIS — E782 Mixed hyperlipidemia: Secondary | ICD-10-CM | POA: Diagnosis not present

## 2023-07-13 DIAGNOSIS — I739 Peripheral vascular disease, unspecified: Secondary | ICD-10-CM | POA: Diagnosis not present

## 2023-07-13 DIAGNOSIS — E039 Hypothyroidism, unspecified: Secondary | ICD-10-CM | POA: Diagnosis not present

## 2023-07-13 DIAGNOSIS — R7301 Impaired fasting glucose: Secondary | ICD-10-CM | POA: Diagnosis not present

## 2023-07-13 DIAGNOSIS — M542 Cervicalgia: Secondary | ICD-10-CM | POA: Diagnosis not present

## 2023-07-13 DIAGNOSIS — I10 Essential (primary) hypertension: Secondary | ICD-10-CM | POA: Diagnosis not present

## 2023-07-13 DIAGNOSIS — E785 Hyperlipidemia, unspecified: Secondary | ICD-10-CM | POA: Diagnosis not present

## 2023-07-13 DIAGNOSIS — M810 Age-related osteoporosis without current pathological fracture: Secondary | ICD-10-CM | POA: Diagnosis not present

## 2024-01-06 DIAGNOSIS — H25813 Combined forms of age-related cataract, bilateral: Secondary | ICD-10-CM | POA: Diagnosis not present

## 2024-01-07 DIAGNOSIS — M810 Age-related osteoporosis without current pathological fracture: Secondary | ICD-10-CM | POA: Diagnosis not present

## 2024-01-07 DIAGNOSIS — E039 Hypothyroidism, unspecified: Secondary | ICD-10-CM | POA: Diagnosis not present

## 2024-01-07 DIAGNOSIS — E782 Mixed hyperlipidemia: Secondary | ICD-10-CM | POA: Diagnosis not present

## 2024-01-07 DIAGNOSIS — R7301 Impaired fasting glucose: Secondary | ICD-10-CM | POA: Diagnosis not present

## 2024-01-07 DIAGNOSIS — M109 Gout, unspecified: Secondary | ICD-10-CM | POA: Diagnosis not present

## 2024-01-14 DIAGNOSIS — R7301 Impaired fasting glucose: Secondary | ICD-10-CM | POA: Diagnosis not present

## 2024-01-14 DIAGNOSIS — E039 Hypothyroidism, unspecified: Secondary | ICD-10-CM | POA: Diagnosis not present

## 2024-01-14 DIAGNOSIS — I10 Essential (primary) hypertension: Secondary | ICD-10-CM | POA: Diagnosis not present

## 2024-01-14 DIAGNOSIS — Z85828 Personal history of other malignant neoplasm of skin: Secondary | ICD-10-CM | POA: Diagnosis not present

## 2024-01-14 DIAGNOSIS — K219 Gastro-esophageal reflux disease without esophagitis: Secondary | ICD-10-CM | POA: Diagnosis not present

## 2024-01-14 DIAGNOSIS — I739 Peripheral vascular disease, unspecified: Secondary | ICD-10-CM | POA: Diagnosis not present

## 2024-01-14 DIAGNOSIS — E782 Mixed hyperlipidemia: Secondary | ICD-10-CM | POA: Diagnosis not present

## 2024-01-14 DIAGNOSIS — M542 Cervicalgia: Secondary | ICD-10-CM | POA: Diagnosis not present

## 2024-01-14 DIAGNOSIS — Z0000 Encounter for general adult medical examination without abnormal findings: Secondary | ICD-10-CM | POA: Diagnosis not present

## 2024-01-14 DIAGNOSIS — M109 Gout, unspecified: Secondary | ICD-10-CM | POA: Diagnosis not present

## 2024-01-14 DIAGNOSIS — M810 Age-related osteoporosis without current pathological fracture: Secondary | ICD-10-CM | POA: Diagnosis not present

## 2024-01-14 DIAGNOSIS — Z23 Encounter for immunization: Secondary | ICD-10-CM | POA: Diagnosis not present

## 2024-02-01 DIAGNOSIS — H02834 Dermatochalasis of left upper eyelid: Secondary | ICD-10-CM | POA: Diagnosis not present

## 2024-02-01 DIAGNOSIS — H25813 Combined forms of age-related cataract, bilateral: Secondary | ICD-10-CM | POA: Diagnosis not present

## 2024-02-01 DIAGNOSIS — H01001 Unspecified blepharitis right upper eyelid: Secondary | ICD-10-CM | POA: Diagnosis not present

## 2024-02-01 DIAGNOSIS — H02831 Dermatochalasis of right upper eyelid: Secondary | ICD-10-CM | POA: Diagnosis not present

## 2024-02-29 DIAGNOSIS — H25812 Combined forms of age-related cataract, left eye: Secondary | ICD-10-CM | POA: Diagnosis not present

## 2024-03-02 NOTE — H&P (Signed)
 Surgical History & Physical  Patient Name: Toni Gibson  DOB: 09/19/1945  Surgery: Cataract extraction with intraocular lens implant phacoemulsification; Left Eye Surgeon: Lynwood Hermann MD Surgery Date: 03/07/2024 Pre-Op Date: 02/01/2024  HPI: A 31 Yr. old female patient 1. The patient complains of difficulty when driving at night, which began 8 months ago. Both eyes are affected. The episode is constant. The patient describes glare and hazy symptoms affecting their eyes/vision. The condition's severity is worsening. Symptoms occur when the patient is driving. New Patient Cataract Evaluation. Pt c/o decreased vision. She describes it as blurred, and cloudy vision with no improvement from wearing glasses. She also reports decreased near vision with no improvement from spectacles. The pt also c/o decreased night vision with excessive glare from headlights, and bright lights. This is negatively affecting the patient's quality of life and the patient is unable to function adequately in life with the current level of vision. HPI Completed by Dr. Lynwood Hermann  Medical History: Dry Eyes Cataracts  Arthritis High Blood Pressure Thyroid  Problems  Review of Systems Cardiovascular High Blood Pressure Eyes Cataracts/Blurred Vision All recorded systems are negative except as noted above.  Social Never smoked Alcohol Never  Medications Restasis, Prednisolone-moxiflox-bromfen, Lisinopril-hydrochlorothiazide, Levothyroxine, Allopurinol  Sx/Procedures Back - Bump removed, C-Section  Drug Allergies  NKDA  History & Physical: Heent: cataracts NECK: supple without bruits LUNGS: lungs clear to auscultation CV: regular rate and rhythm Abdomen: soft and non-tender  Impression & Plan: Assessment: 1. COMBINED FORMS AGE RELATED CATARACT; Both Eyes (H25.813) 2. DERMATOCHALASIS, no surgery; Right Upper Lid, Left Upper Lid (H02.831, H02.834) 3. BLEPHARITIS; Right Upper Lid, Right Lower Lid,  Left Upper Lid, Left Lower Lid (H01.001, H01.002,H01.004,H01.005) 4. ASTIGMATISM, REGULAR; Both Eyes (H52.223)  Plan: 1. Cataract accounts for the patient's decreased vision. This visual impairment is not correctable with a tolerable change in glasses or contact lenses. Cataract surgery with an implantation of a new lens should significantly improve the visual and functional status of the patient. Recommend phacoemulsification with intraocular lens. Discussed all risks, benefits, alternatives, and potential complications. Discussed the procedures and recovery. The patient desires to have surgery. A-scan/Biometry ordered and will be performed for intraocular lens calculations. The surgery will be performed in order to improve vision for driving, reading, and for eye examinations. Recommend Dextenza  for post-operative pain and inflammation. Educational materials provided: Cataract. History of corneal refractive Surgery: None History of Previous Ocular Surgery (PPV, other): None History of ocular trauma: None Use of Eye Pressure Lowering Drops: None Left eye worse to patient - left eye first. No current contact lens use. Pupil Status: Dilates well - shugarcaine or Lidocaine +Omidira by protocol Recommend Toric Lens.  2. Asymptomatic, recommend observation for now. Findings, prognosis and treatment options reviewed.  3. Blepharitis is present - recommend regular lid cleaning.  4. Recommend Toric Lens OU

## 2024-03-03 ENCOUNTER — Other Ambulatory Visit: Payer: Self-pay

## 2024-03-03 ENCOUNTER — Encounter (HOSPITAL_COMMUNITY): Payer: Self-pay

## 2024-03-03 ENCOUNTER — Encounter (HOSPITAL_COMMUNITY)
Admission: RE | Admit: 2024-03-03 | Discharge: 2024-03-03 | Disposition: A | Discharge status: Home / Self Care | Source: Ambulatory Visit | Attending: Ophthalmology | Admitting: Ophthalmology

## 2024-03-03 HISTORY — DX: Gout, unspecified: M10.9

## 2024-03-07 ENCOUNTER — Other Ambulatory Visit: Payer: Self-pay

## 2024-03-07 ENCOUNTER — Encounter (HOSPITAL_COMMUNITY)
Admission: RE | Disposition: A | Discharge status: Home / Self Care | Payer: Self-pay | Source: Home / Self Care | Attending: Ophthalmology

## 2024-03-07 ENCOUNTER — Ambulatory Visit (HOSPITAL_COMMUNITY)
Admission: RE | Admit: 2024-03-07 | Discharge: 2024-03-07 | Disposition: A | Discharge status: Home / Self Care | Attending: Ophthalmology | Admitting: Ophthalmology

## 2024-03-07 ENCOUNTER — Encounter (HOSPITAL_COMMUNITY): Payer: Self-pay | Admitting: Ophthalmology

## 2024-03-07 ENCOUNTER — Ambulatory Visit (HOSPITAL_COMMUNITY): Admitting: Anesthesiology

## 2024-03-07 DIAGNOSIS — H0100A Unspecified blepharitis right eye, upper and lower eyelids: Secondary | ICD-10-CM | POA: Diagnosis not present

## 2024-03-07 DIAGNOSIS — H02834 Dermatochalasis of left upper eyelid: Secondary | ICD-10-CM | POA: Insufficient documentation

## 2024-03-07 DIAGNOSIS — H02831 Dermatochalasis of right upper eyelid: Secondary | ICD-10-CM | POA: Diagnosis not present

## 2024-03-07 DIAGNOSIS — I10 Essential (primary) hypertension: Secondary | ICD-10-CM

## 2024-03-07 DIAGNOSIS — H25812 Combined forms of age-related cataract, left eye: Secondary | ICD-10-CM | POA: Diagnosis not present

## 2024-03-07 DIAGNOSIS — Z87891 Personal history of nicotine dependence: Secondary | ICD-10-CM

## 2024-03-07 DIAGNOSIS — H5712 Ocular pain, left eye: Secondary | ICD-10-CM | POA: Insufficient documentation

## 2024-03-07 DIAGNOSIS — H0100B Unspecified blepharitis left eye, upper and lower eyelids: Secondary | ICD-10-CM | POA: Diagnosis not present

## 2024-03-07 DIAGNOSIS — H52223 Regular astigmatism, bilateral: Secondary | ICD-10-CM | POA: Insufficient documentation

## 2024-03-07 HISTORY — PX: CATARACT EXTRACTION W/PHACO: SHX586

## 2024-03-07 HISTORY — PX: INSERTION, STENT, DRUG-ELUTING, LACRIMAL CANALICULUS: SHX7453

## 2024-03-07 SURGERY — PHACOEMULSIFICATION, CATARACT, WITH IOL INSERTION
Anesthesia: Monitor Anesthesia Care | Site: Eye | Laterality: Left

## 2024-03-07 MED ORDER — SODIUM HYALURONATE 10 MG/ML IO SOLUTION
PREFILLED_SYRINGE | INTRAOCULAR | Status: DC | PRN
  Administered 2024-03-07: .85 mL via INTRAOCULAR

## 2024-03-07 MED ORDER — LIDOCAINE HCL (PF) 1 % IJ SOLN
INTRAMUSCULAR | Status: DC | PRN
  Administered 2024-03-07: 1 mL

## 2024-03-07 MED ORDER — SODIUM HYALURONATE 23MG/ML IO SOSY
PREFILLED_SYRINGE | INTRAOCULAR | Status: DC | PRN
Start: 2024-03-07 — End: 2024-03-07
  Administered 2024-03-07: .6 mL via INTRAOCULAR

## 2024-03-07 MED ORDER — PHENYLEPHRINE HCL 2.5 % OP SOLN
1.0000 [drp] | OPHTHALMIC | Status: AC | PRN
  Administered 2024-03-07 (×3): 1 [drp] via OPHTHALMIC

## 2024-03-07 MED ORDER — LIDOCAINE HCL 3.5 % OP GEL
1.0000 | Freq: Once | OPHTHALMIC | Status: AC
  Administered 2024-03-07: 1 via OPHTHALMIC

## 2024-03-07 MED ORDER — TETRACAINE HCL 0.5 % OP SOLN
1.0000 [drp] | OPHTHALMIC | Status: AC | PRN
  Administered 2024-03-07 (×3): 1 [drp] via OPHTHALMIC

## 2024-03-07 MED ORDER — STERILE WATER FOR IRRIGATION IR SOLN
Status: DC | PRN
Start: 2024-03-07 — End: 2024-03-07
  Administered 2024-03-07: 1

## 2024-03-07 MED ORDER — DEXAMETHASONE 0.4 MG OP INST
VAGINAL_INSERT | OPHTHALMIC | Status: AC
  Filled 2024-03-07: qty 1

## 2024-03-07 MED ORDER — LACTATED RINGERS IV SOLN: INTRAVENOUS | Status: DC

## 2024-03-07 MED ORDER — PHENYLEPHRINE-KETOROLAC 1-0.3 % IO SOLN
INTRAOCULAR | Status: DC | PRN
  Administered 2024-03-07: 500 mL via OPHTHALMIC

## 2024-03-07 MED ORDER — DEXAMETHASONE 0.4 MG OP INST
VAGINAL_INSERT | OPHTHALMIC | Status: DC | PRN
Start: 2024-03-07 — End: 2024-03-07
  Administered 2024-03-07: .4 mg via OPHTHALMIC

## 2024-03-07 MED ORDER — POVIDONE-IODINE 5 % OP SOLN
OPHTHALMIC | Status: DC | PRN
  Administered 2024-03-07: 1 via OPHTHALMIC

## 2024-03-07 MED ORDER — BSS IO SOLN
INTRAOCULAR | Status: DC | PRN
Start: 2024-03-07 — End: 2024-03-07
  Administered 2024-03-07: 15 mL via INTRAOCULAR

## 2024-03-07 MED ORDER — TROPICAMIDE 1 % OP SOLN
1.0000 [drp] | OPHTHALMIC | Status: AC | PRN
  Administered 2024-03-07 (×3): 1 [drp] via OPHTHALMIC

## 2024-03-07 MED ORDER — MOXIFLOXACIN HCL 5 MG/ML IO SOLN
INTRAOCULAR | Status: DC | PRN
Start: 2024-03-07 — End: 2024-03-07
  Administered 2024-03-07: .2 mL via INTRACAMERAL

## 2024-03-07 SURGICAL SUPPLY — 10 items
CLOTH BEACON ORANGE TIMEOUT ST (SAFETY) ×1 IMPLANT
EYE SHIELD UNIVERSAL CLEAR (GAUZE/BANDAGES/DRESSINGS) IMPLANT
FEE CATARACT SUITE SIGHTPATH (MISCELLANEOUS) ×1 IMPLANT
GLOVE BIOGEL PI IND STRL 7.0 (GLOVE) ×2 IMPLANT
LENS IOL TECNIS EYHANCE 19.0 (Intraocular Lens) IMPLANT
NDL HYPO 18GX1.5 BLUNT FILL (NEEDLE) ×1 IMPLANT
PAD ARMBOARD POSITIONER FOAM (MISCELLANEOUS) ×1 IMPLANT
SYR TB 1ML LL NO SAFETY (SYRINGE) ×1 IMPLANT
TAPE SURG TRANSPORE 1 IN (GAUZE/BANDAGES/DRESSINGS) IMPLANT
WATER STERILE IRR 250ML POUR (IV SOLUTION) ×1 IMPLANT

## 2024-03-07 NOTE — Transfer of Care (Signed)
 Immediate Anesthesia Transfer of Care Note  Patient: Toni Gibson  Procedure(s) Performed: PHACOEMULSIFICATION, CATARACT, WITH IOL INSERTION (Left: Eye) INSERTION, STENT, DRUG-ELUTING, LACRIMAL CANALICULUS (Left: Eye)  Patient Location: PACU  Anesthesia Type:MAC  Level of Consciousness: awake and alert   Airway & Oxygen Therapy: Patient Spontanous Breathing and Patient connected to nasal cannula oxygen  Post-op Assessment: Report given to RN and Post -op Vital signs reviewed and stable  Post vital signs: Reviewed and stable  Last Vitals:  Vitals Value Taken Time  BP 175/65 03/07/24 14:23  Temp 36.7 C 03/07/24 14:23  Pulse 82 03/07/24 14:23  Resp 18 03/07/24 14:23  SpO2 95 % 03/07/24 14:23    Last Pain:  Vitals:   03/07/24 1423  TempSrc: Oral  PainSc: 0-No pain      Patients Stated Pain Goal: 4 (03/07/24 1217)  Complications: No notable events documented.

## 2024-03-07 NOTE — Op Note (Signed)
 Date of procedure: 03/07/24  Pre-operative diagnosis: Visually significant age-related combined cataract, Left Eye (H25.812)  Post-operative diagnosis:  Visually significant age-related combined cataract, Left Eye (H25.812) 2.   Pain and inflammation following cataract surgery, Left Eye (H57.12)  Procedure:  Removal of cataract via phacoemulsification and insertion of intra-ocular lens [employer] DIB00 +19.0D into the capsular bag of the Left Eye 2. Placement of Dextenza  Implant, Left Lower Lid  Attending surgeon: Lynwood LABOR. Danniel Grenz, MD, MA  Anesthesia: MAC, Topical [place]   Complications: None  Estimated Blood Loss: <22mL (minimal)  Specimens: None  Implants: As above  Indications:  Visually significant age-related cataract, Left Eye  Procedure:  The patient was seen and identified in the pre-operative area. The operative eye was identified and dilated.  The operative eye was marked.  Topical anesthesia was administered to the operative eye.     The patient was then to the operative suite and placed in the supine position.  A timeout was performed confirming the patient, procedure to be performed, and all other relevant information.   The patient's face was prepped and draped in the usual fashion for intra-ocular surgery.  A lid speculum was placed into the operative eye and the surgical microscope moved into place and focused.  An inferotemporal paracentesis was created using a 20 gauge paracentesis blade. Omidria  was injected into the anterior chamber. Shugarcaine was injected into the anterior chamber.  Viscoelastic was injected into the anterior chamber.  A temporal clear-corneal main wound incision was created using a 2.20mm microkeratome.  A continuous curvilinear capsulorrhexis was initiated using an irrigating cystitome and completed using capsulorrhexis forceps.  Hydrodissection and hydrodeliniation were performed.  Viscoelastic was injected into the anterior chamber.  A  phacoemulsification handpiece and a chopper as a second instrument were used to remove the nucleus and epinucleus. The irrigation/aspiration handpiece was used to remove any remaining cortical material.   The capsular bag was reinflated with viscoelastic, checked, and found to be intact.  The intraocular lens was inserted into the capsular bag.  The irrigation/aspiration handpiece was used to remove any remaining viscoelastic.  The clear corneal wound and paracentesis wounds were then hydrated and checked with Weck-Cels to be watertight.  0.1mL of moxifloxacin  was injected into the anterior chamber.  The lid-speculum was removed. The lower punctum was dilated. A Dextenza  implant was placed in the lower canaliculus without complication.   The drape was removed.  The patient's face was cleaned with a wet and dry 4x4.   A clear shield was taped over the eye. The patient was taken to the post-operative care unit in good condition, having tolerated the procedure well.  Post-Op Instructions: The patient will follow up at [facility] for a same day post-operative evaluation and will receive all other orders and instructions.

## 2024-03-07 NOTE — Interval H&P Note (Signed)
 History and Physical Interval Note:  03/07/2024 1:54 PM  Toni Gibson  has presented today for surgery, with the diagnosis of combined forms age related cataract, left eye.  The various methods of treatment have been discussed with the patient and family. After consideration of risks, benefits and other options for treatment, the patient has consented to  Procedure(s): PHACOEMULSIFICATION, CATARACT, WITH IOL INSERTION (Left) INSERTION, STENT, DRUG-ELUTING, LACRIMAL CANALICULUS (Left) as a surgical intervention.  The patient's history has been reviewed, patient examined, no change in status, stable for surgery.  I have reviewed the patient's chart and labs.  Questions were answered to the patient's satisfaction.     HARRIE AGENT

## 2024-03-07 NOTE — Anesthesia Preprocedure Evaluation (Signed)
Anesthesia Evaluation  Patient identified by MRN, date of birth, ID band Patient awake    Reviewed: Allergy & Precautions, H&P , NPO status , Patient's Chart, lab work & pertinent test results, reviewed documented beta blocker date and time   Airway Mallampati: II  TM Distance: >3 FB Neck ROM: full    Dental no notable dental hx.    Pulmonary neg pulmonary ROS, former smoker   Pulmonary exam normal breath sounds clear to auscultation       Cardiovascular Exercise Tolerance: Good hypertension, negative cardio ROS  Rhythm:regular Rate:Normal     Neuro/Psych negative neurological ROS  negative psych ROS   GI/Hepatic negative GI ROS, Neg liver ROS,,,  Endo/Other  negative endocrine ROS    Renal/GU negative Renal ROS  negative genitourinary   Musculoskeletal   Abdominal   Peds  Hematology negative hematology ROS (+)   Anesthesia Other Findings   Reproductive/Obstetrics negative OB ROS                             Anesthesia Physical Anesthesia Plan  ASA: 2  Anesthesia Plan: MAC   Post-op Pain Management:    Induction:   PONV Risk Score and Plan:   Airway Management Planned:   Additional Equipment:   Intra-op Plan:   Post-operative Plan:   Informed Consent: I have reviewed the patients History and Physical, chart, labs and discussed the procedure including the risks, benefits and alternatives for the proposed anesthesia with the patient or authorized representative who has indicated his/her understanding and acceptance.     Dental Advisory Given  Plan Discussed with: CRNA  Anesthesia Plan Comments:         Anesthesia Quick Evaluation  

## 2024-03-07 NOTE — Discharge Instructions (Signed)
 Please discharge patient when stable, will follow up today with Dr. June Leap at the [facility] office immediately following discharge.  Leave shield in place until visit.  All paperwork with discharge instructions will be given at the office.  [facility] Address:  [address]  [place], [place] [address]

## 2024-03-08 ENCOUNTER — Encounter (HOSPITAL_COMMUNITY): Payer: Self-pay | Admitting: Ophthalmology

## 2024-03-11 NOTE — Anesthesia Postprocedure Evaluation (Signed)
 Anesthesia Post Note  Patient: Toni Gibson  Procedure(s) Performed: PHACOEMULSIFICATION, CATARACT, WITH IOL INSERTION (Left: Eye) INSERTION, STENT, DRUG-ELUTING, LACRIMAL CANALICULUS (Left: Eye)  Patient location during evaluation: Phase II Anesthesia Type: MAC Level of consciousness: awake Pain management: pain level controlled Vital Signs Assessment: post-procedure vital signs reviewed and stable Respiratory status: spontaneous breathing and respiratory function stable Cardiovascular status: blood pressure returned to baseline and stable Postop Assessment: no headache and no apparent nausea or vomiting Anesthetic complications: no Comments: Late entry   No notable events documented.   Last Vitals:  Vitals:   03/07/24 1217 03/07/24 1423  BP: (!) 147/69 (!) 175/65  Pulse: 83 82  Resp: 15 18  Temp: 36.7 C 36.7 C  SpO2: 92% 95%    Last Pain:  Vitals:   03/08/24 1515  TempSrc:   PainSc: 0-No pain                 [name]

## 2024-04-25 ENCOUNTER — Encounter (HOSPITAL_COMMUNITY)
Admission: RE | Admit: 2024-04-25 | Discharge: 2024-04-25 | Disposition: A | Discharge status: Home / Self Care | Source: Ambulatory Visit | Attending: Ophthalmology | Admitting: Ophthalmology

## 2024-04-25 ENCOUNTER — Encounter (HOSPITAL_COMMUNITY): Payer: Self-pay

## 2024-04-25 ENCOUNTER — Other Ambulatory Visit: Payer: Self-pay

## 2024-04-25 NOTE — H&P (Signed)
 Surgical History & Physical  Patient Name: Toni Gibson  DOB: 13-Aug-1945  Surgery: Cataract extraction with intraocular lens implant phacoemulsification; Right Eye Surgeon: Lynwood Hermann MD Surgery Date: 04/29/2024 Pre-Op Date: 03/14/2024  HPI: A 68 Yr. old female patient 1. The patient is returning after cataract post-op. The left eye is affected. The patient's vision is improved. Patient is following medication instructions. The patient experiences no flashes, floater, shadow, curtain or veil. 2. The patient is returning for a cataract follow-up of the right eye. pt. c/o of glare on sunny days, poor night vision and persistent blurry [place]. This is negatively affecting the patient's quality of life and the patient is unable to function adequately in life with the current level of vision. HPI Completed by Dr. Lynwood Hermann  Medical History: Dry Eyes Cataracts  Arthritis High Blood Pressure Thyroid  Problems  Review of Systems Cardiovascular High Blood Pressure Eyes Cataracts/Blurred Vision All recorded systems are negative except as noted above  Social Never smoked Alcohol Never  Medication Restasis, Prednisolone-moxiflox-bromfen,  Lisinopril-hydrochlorothiazide, Levothyroxine, Allopurinol  Sx/Procedures Phaco c IOL OS - Dextenza ,  Back - Bump removed, C-Section  Drug Allergies  NKDA  History & Physical: Heent: cataract NECK: supple without bruits LUNGS: lungs clear to auscultation CV: regular rate and rhythm Abdomen: soft and non-tender  Impression & Plan: Assessment: 1.  CATARACT EXTRACTION STATUS; Left Eye (Z98.42) 2.  COMBINED FORMS AGE RELATED CATARACT; Right Eye (H25.811) 3.  NUCLEAR SCLEROSIS AGE RELATED; Right Eye (H25.11)  Plan: 1.  1 week after cataract surgery. Doing well with improved vision and normal eye pressure. Call with any problems or concerns. Continue Pred-Mox-Brom Combo drop 2x/day for 1 more week.  2.  Cataract accounts for the patient's  decreased vision. This visual impairment is not correctable with a tolerable change in glasses or contact lenses. Cataract surgery with an implantation of a new lens should significantly improve the visual and functional status of the patient. Recommend phacoemulsification with intraocular lens. Discussed all risks, benefits, alternatives, and potential complications. Discussed the procedures and recovery. The patient desires to have surgery. A-scan/Biometry ordered and will be performed for intraocular lens calculations. The surgery will be performed in order to improve vision for driving, reading, and for eye examinations. Recommend Dextenza  for post-operative pain and inflammation. Educational materials provided: Cataract. History of corneal refractive Surgery: None History of Previous Ocular Surgery (PPV, other): None History of ocular trauma: None Use of Eye Pressure Lowering Drops: None Right Eye. No current contact lens use. Pupil Status: Dilates well - shugarcaine or Lidocaine +Omidira by protocol Recommend Toric Lens.  3.  See above

## 2024-04-29 ENCOUNTER — Encounter (HOSPITAL_COMMUNITY): Admission: RE | Disposition: A | Payer: Self-pay | Source: Home / Self Care | Attending: Ophthalmology

## 2024-04-29 ENCOUNTER — Ambulatory Visit (HOSPITAL_COMMUNITY)
Admission: RE | Admit: 2024-04-29 | Discharge: 2024-04-29 | Disposition: A | Discharge status: Home / Self Care | Attending: Ophthalmology | Admitting: Ophthalmology

## 2024-04-29 ENCOUNTER — Ambulatory Visit (HOSPITAL_COMMUNITY): Admitting: Anesthesiology

## 2024-04-29 ENCOUNTER — Encounter (HOSPITAL_COMMUNITY): Payer: Self-pay | Admitting: Ophthalmology

## 2024-04-29 DIAGNOSIS — I10 Essential (primary) hypertension: Secondary | ICD-10-CM | POA: Insufficient documentation

## 2024-04-29 DIAGNOSIS — Z9842 Cataract extraction status, left eye: Secondary | ICD-10-CM | POA: Diagnosis not present

## 2024-04-29 DIAGNOSIS — H25811 Combined forms of age-related cataract, right eye: Secondary | ICD-10-CM | POA: Diagnosis present

## 2024-04-29 DIAGNOSIS — Z87891 Personal history of nicotine dependence: Secondary | ICD-10-CM | POA: Insufficient documentation

## 2024-04-29 DIAGNOSIS — H5711 Ocular pain, right eye: Secondary | ICD-10-CM | POA: Diagnosis present

## 2024-04-29 HISTORY — PX: INSERTION, STENT, DRUG-ELUTING, LACRIMAL CANALICULUS: SHX7453

## 2024-04-29 HISTORY — PX: CATARACT EXTRACTION W/PHACO: SHX586

## 2024-04-29 MED ORDER — LIDOCAINE HCL 3.5 % OP GEL
1.0000 | Freq: Once | OPHTHALMIC | Status: AC
  Administered 2024-04-29: 1 via OPHTHALMIC

## 2024-04-29 MED ORDER — MOXIFLOXACIN HCL 5 MG/ML IO SOLN
INTRAOCULAR | Status: DC | PRN
  Administered 2024-04-29: .2 mL via INTRACAMERAL

## 2024-04-29 MED ORDER — LIDOCAINE HCL (PF) 1 % IJ SOLN
INTRAMUSCULAR | Status: DC | PRN
  Administered 2024-04-29: 1 mL

## 2024-04-29 MED ORDER — TETRACAINE 0.5 % OP SOLN OPTIME - NO CHARGE
OPHTHALMIC | Status: DC | PRN
  Administered 2024-04-29: 2 [drp] via OPHTHALMIC

## 2024-04-29 MED ORDER — DEXAMETHASONE 0.4 MG OP INST
VAGINAL_INSERT | OPHTHALMIC | Status: AC
  Filled 2024-04-29: qty 1

## 2024-04-29 MED ORDER — TROPICAMIDE 1 % OP SOLN
1.0000 [drp] | OPHTHALMIC | Status: AC | PRN
  Administered 2024-04-29 (×3): 1 [drp] via OPHTHALMIC

## 2024-04-29 MED ORDER — PHENYLEPHRINE-KETOROLAC 1-0.3 % IO SOLN
INTRAOCULAR | Status: DC | PRN
  Administered 2024-04-29: 500 mL via OPHTHALMIC

## 2024-04-29 MED ORDER — SODIUM HYALURONATE 10 MG/ML IO SOLUTION
PREFILLED_SYRINGE | INTRAOCULAR | Status: DC | PRN
  Administered 2024-04-29: .85 mL via INTRAOCULAR

## 2024-04-29 MED ORDER — SODIUM HYALURONATE 23MG/ML IO SOSY
PREFILLED_SYRINGE | INTRAOCULAR | Status: DC | PRN
  Administered 2024-04-29: .6 mL via INTRAOCULAR

## 2024-04-29 MED ORDER — DEXAMETHASONE 0.4 MG OP INST
VAGINAL_INSERT | OPHTHALMIC | Status: DC | PRN
  Administered 2024-04-29: .4 mg via OPHTHALMIC

## 2024-04-29 MED ORDER — STERILE WATER FOR IRRIGATION IR SOLN
Status: DC | PRN
  Administered 2024-04-29: 1

## 2024-04-29 MED ORDER — TETRACAINE HCL 0.5 % OP SOLN
1.0000 [drp] | OPHTHALMIC | Status: AC | PRN
  Administered 2024-04-29 (×3): 1 [drp] via OPHTHALMIC

## 2024-04-29 MED ORDER — POVIDONE-IODINE 5 % OP SOLN
OPHTHALMIC | Status: DC | PRN
  Administered 2024-04-29: 1 via OPHTHALMIC

## 2024-04-29 MED ORDER — PHENYLEPHRINE HCL 2.5 % OP SOLN
1.0000 [drp] | OPHTHALMIC | Status: AC | PRN
  Administered 2024-04-29 (×3): 1 [drp] via OPHTHALMIC

## 2024-04-29 MED ORDER — BSS IO SOLN
INTRAOCULAR | Status: DC | PRN
  Administered 2024-04-29: 15 mL via INTRAOCULAR

## 2024-04-29 NOTE — Discharge Instructions (Addendum)
 Please discharge patient when stable, will follow up today with Dr. June Leap at the [facility] office immediately following discharge.  Leave shield in place until visit.  All paperwork with discharge instructions will be given at the office.  [facility] Address:  [address]  [place], [place] [address]

## 2024-04-29 NOTE — Anesthesia Preprocedure Evaluation (Signed)
Anesthesia Evaluation  Patient identified by MRN, date of birth, ID band Patient awake    Reviewed: Allergy & Precautions, H&P , NPO status , Patient's Chart, lab work & pertinent test results, reviewed documented beta blocker date and time   Airway Mallampati: II  TM Distance: >3 FB Neck ROM: full    Dental no notable dental hx.    Pulmonary neg pulmonary ROS, former smoker   Pulmonary exam normal breath sounds clear to auscultation       Cardiovascular Exercise Tolerance: Good hypertension, negative cardio ROS  Rhythm:regular Rate:Normal     Neuro/Psych negative neurological ROS  negative psych ROS   GI/Hepatic negative GI ROS, Neg liver ROS,,,  Endo/Other  negative endocrine ROS    Renal/GU negative Renal ROS  negative genitourinary   Musculoskeletal   Abdominal   Peds  Hematology negative hematology ROS (+)   Anesthesia Other Findings   Reproductive/Obstetrics negative OB ROS                             Anesthesia Physical Anesthesia Plan  ASA: 2  Anesthesia Plan: MAC   Post-op Pain Management:    Induction:   PONV Risk Score and Plan:   Airway Management Planned:   Additional Equipment:   Intra-op Plan:   Post-operative Plan:   Informed Consent: I have reviewed the patients History and Physical, chart, labs and discussed the procedure including the risks, benefits and alternatives for the proposed anesthesia with the patient or authorized representative who has indicated his/her understanding and acceptance.     Dental Advisory Given  Plan Discussed with: CRNA  Anesthesia Plan Comments:         Anesthesia Quick Evaluation  

## 2024-04-29 NOTE — Anesthesia Postprocedure Evaluation (Signed)
"   Anesthesia Post Note  Patient: Toni Gibson  Procedure(s) Performed: PHACOEMULSIFICATION, CATARACT, WITH IOL INSERTION (Right: Eye) INSERTION, STENT, DRUG-ELUTING, LACRIMAL CANALICULUS (Right: Eye)  Patient location during evaluation: Phase II Anesthesia Type: MAC Level of consciousness: awake Pain management: pain level controlled Vital Signs Assessment: post-procedure vital signs reviewed and stable Respiratory status: spontaneous breathing and respiratory function stable Cardiovascular status: blood pressure returned to baseline and stable Postop Assessment: no headache and no apparent nausea or vomiting Anesthetic complications: no Comments: Late entry   No notable events documented.   Last Vitals:  Vitals:   04/29/24 0719 04/29/24 0841  BP: (!) 158/86 (!) 164/74  Pulse:  67  Resp: 14 14  Temp: 36.4 C 36.6 C  SpO2: 100% 97%    Last Pain:  Vitals:   04/29/24 0841  TempSrc: Oral  PainSc: 0-No pain                 [name] [name]      "

## 2024-04-29 NOTE — Transfer of Care (Signed)
 Immediate Anesthesia Transfer of Care Note  Patient: Toni Gibson  Procedure(s) Performed: PHACOEMULSIFICATION, CATARACT, WITH IOL INSERTION (Right: Eye) INSERTION, STENT, DRUG-ELUTING, LACRIMAL CANALICULUS (Right: Eye)  Patient Location: Short Stay  Anesthesia Type:MAC  Level of Consciousness: awake and patient cooperative  Airway & Oxygen Therapy: Patient Spontanous Breathing  Post-op Assessment: Report given to RN and Post -op Vital signs reviewed and stable  Post vital signs: Reviewed and stable  Last Vitals:  Vitals Value Taken Time  BP 164/74 04/29/24 08:41  Temp 36.6 C 04/29/24 08:41  Pulse 67 04/29/24 08:41  Resp 14 04/29/24 08:41  SpO2 97 % 04/29/24 08:41    Last Pain:  Vitals:   04/29/24 0841  TempSrc: Oral  PainSc: 0-No pain         Complications: No notable events documented.

## 2024-04-29 NOTE — Interval H&P Note (Signed)
 History and Physical Interval Note:  04/29/2024 8:13 AM  Toni Gibson  has presented today for surgery, with the diagnosis of combined forms age related cataract, right eye.  The various methods of treatment have been discussed with the patient and family. After consideration of risks, benefits and other options for treatment, the patient has consented to  Procedures with comments: PHACOEMULSIFICATION, CATARACT, WITH IOL INSERTION (Right) - CDE: INSERTION, STENT, DRUG-ELUTING, LACRIMAL CANALICULUS (Right) as a surgical intervention.  The patient's history has been reviewed, patient examined, no change in status, stable for surgery.  I have reviewed the patient's chart and labs.  Questions were answered to the patient's satisfaction.     HARRIE AGENT

## 2024-04-29 NOTE — Op Note (Signed)
 Date of procedure: 04/29/24  Pre-operative diagnosis:  Visually significant combined form age-related cataract, Right Eye (H25.811)  Post-operative diagnosis:   1. Visually significant combined form age-related cataract, Right Eye (H25.811) 2. Pain and inflammation following cataract surgery Right Eye (H57.11)  Procedure:  Removal of cataract via phacoemulsification and insertion of intra-ocular lens [employer] DIB00 +18.5D into the capsular bag of the Right Eye 2. Placement of Dextenza  insert, Right Eye  Attending surgeon: Lynwood LABOR. Ruta Capece, MD, MA  Anesthesia: MAC, Topical Akten   Complications: None  Estimated Blood Loss: <12mL (minimal)  Specimens: None  Implants: As above  Indications:  Visually significant age-related cataract, Right Eye  Procedure:  The patient was seen and identified in the pre-operative area. The operative eye was identified and dilated.  The operative eye was marked.  Topical anesthesia was administered to the operative eye.     The patient was then to the operative suite and placed in the supine position.  A timeout was performed confirming the patient, procedure to be performed, and all other relevant information.   The patient's face was prepped and draped in the usual fashion for intra-ocular surgery.  A lid speculum was placed into the operative eye and the surgical microscope moved into place and focused.  A superotemporal paracentesis was created using a 20 gauge paracentesis blade. Omidria  was injected into the anterior chamber. Shugarcaine was injected into the anterior chamber.  Viscoelastic was injected into the anterior chamber.  A temporal clear-corneal main wound incision was created using a 2.53mm microkeratome.  A continuous curvilinear capsulorrhexis was initiated using an irrigating cystitome and completed using capsulorrhexis forceps.  Hydrodissection and hydrodeliniation were performed.  Viscoelastic was injected into the anterior  chamber.  A phacoemulsification handpiece and a chopper as a second instrument were used to remove the nucleus and epinucleus. The irrigation/aspiration handpiece was used to remove any remaining cortical material.   The capsular bag was reinflated with viscoelastic, checked, and found to be intact.  The intraocular lens was inserted into the capsular bag.  The irrigation/aspiration handpiece was used to remove any remaining viscoelastic.  The clear corneal wound and paracentesis wounds were then hydrated and checked with Weck-Cels to be watertight. 0.1mL of moxifloxacin  was injected into the anterior chamber.  The lid-speculum was removed. The lower punctum was dilated. A Dextenza  implant was placed in the lower canaliculus without complication.  The drape was removed.  The patient's face was cleaned with a wet and dry 4x4. A clear shield was taped over the eye. The patient was taken to the post-operative care unit in good condition, having tolerated the procedure well.  Post-Op Instructions: The patient will follow up at [facility] for a same day post-operative evaluation and will receive all other orders and instructions.

## 2024-04-29 NOTE — Anesthesia Procedure Notes (Signed)
 Date/Time: 04/29/2024 8:18 AM  Performed by: Barbarann Verneita RAMAN, CRNAPre-anesthesia Checklist: Patient identified, Emergency Drugs available, Suction available, Timeout performed and Patient being monitored Patient Re-evaluated:Patient Re-evaluated prior to induction Oxygen Delivery Method: Nasal Cannula

## 2024-05-02 ENCOUNTER — Encounter (HOSPITAL_COMMUNITY): Payer: Self-pay | Admitting: Ophthalmology
